# Patient Record
Sex: Male | Born: 1985 | Race: White | Hispanic: No | Marital: Married | Smoking: Never smoker
Health system: Southern US, Community
[De-identification: ages and names within clinical notes are randomized; demographics above are authoritative.]

## PROBLEM LIST (undated history)

## (undated) DIAGNOSIS — G43909 Migraine, unspecified, not intractable, without status migrainosus: Secondary | ICD-10-CM

## (undated) HISTORY — DX: Migraine, unspecified, not intractable, without status migrainosus: G43.909

---

## 2014-10-26 ENCOUNTER — Telehealth: Payer: Self-pay | Admitting: Physician Assistant

## 2014-10-26 NOTE — Telephone Encounter (Signed)
Pt called to establish with a doctor.  Pt is new to the area.  Pt has [employer].  I have scheduled appointment with pt/MJ

## 2014-11-01 ENCOUNTER — Ambulatory Visit (INDEPENDENT_AMBULATORY_CARE_PROVIDER_SITE_OTHER): Payer: BLUE CROSS/BLUE SHIELD | Admitting: Physician Assistant

## 2014-11-01 ENCOUNTER — Encounter: Payer: Self-pay | Admitting: Physician Assistant

## 2014-11-01 VITALS — BP 110/58 | HR 62 | Temp 98.1°F | Resp 64 | Ht 70.0 in | Wt 186.0 lb

## 2014-11-01 DIAGNOSIS — G43119 Migraine with aura, intractable, without status migrainosus: Secondary | ICD-10-CM

## 2014-11-01 DIAGNOSIS — G43909 Migraine, unspecified, not intractable, without status migrainosus: Secondary | ICD-10-CM | POA: Insufficient documentation

## 2014-11-01 DIAGNOSIS — Z418 Encounter for other procedures for purposes other than remedying health state: Secondary | ICD-10-CM | POA: Diagnosis not present

## 2014-11-01 DIAGNOSIS — Z298 Encounter for other specified prophylactic measures: Secondary | ICD-10-CM

## 2014-11-01 MED ORDER — ACETAZOLAMIDE 125 MG PO TABS: 125.0000 mg | ORAL_TABLET | Freq: Two times a day (BID) | ORAL | Status: DC

## 2014-11-01 NOTE — Patient Instructions (Signed)
Altitude Sickness °Altitude sickness occurs when a person goes to a high altitude (at least 8,200 ft [2,460 m] above sea level) without first letting the body adjust to the higher altitude (acclimate). Symptoms generally develop within 72 hours of arriving at high altitude. It can happen to anyone, regardless of physical condition. Altitude sickness is a medical emergency that can develop into a life-threatening condition.  °CAUSES  °Altitude sickness is caused by rapidly ascending to higher altitudes that expose you to lower air pressure and lower oxygen levels. Going to high altitudes quickly and exerting yourself can make altitude sickness more likely to occur.  °SYMPTOMS  °· Severe headache. °· Nausea and vomiting. °· Shortness of breath. °· Dizziness. °· Confusion. °· Uncoordinated movements. °· Fatigue and sleep disturbances. °· Weakness. °· Hallucinations. °DIAGNOSIS  °Your caregiver will take your medical history and perform a physical exam. A chest X-ray may also be taken. °TREATMENT  °In most cases of mild altitude sickness, your symptoms will resolve gradually over 3 to 5 days. If treatment is needed, it begins with moving to a lower altitude (1,800 ft [540 m] above sea level or lower) as quickly and safely as possible. Oxygen and certain medicines may also be given to help with breathing. In severe cases, you may need to stay in the hospital. °PREVENTION  °To prevent altitude sickness during future trips: °· Go to higher altitudes slowly, giving your body time to acclimate. °· Go to higher altitudes during the daytime and return to lower altitudes at night. °· Give your body a few days to adjust to a change in altitude before starting strenuous physical activities. °· Ask your caregiver about medicines you can take to prevent altitude sickness. °HOME CARE INSTRUCTIONS  °· If you must exercise, do so lightly for the first 24 to 36 hours after treatment. °· Drink enough fluids to keep your urine clear or  pale yellow. °· Eat small, light meals. °· Avoid smoking, calming medicines (sedatives), and alcohol. °· Remain at a low altitude. °· Have someone stay with you until you feel stable. °· Keep all follow-up appointments as directed by your caregiver. °SEEK IMMEDIATE MEDICAL CARE IF: °· You have severe shortness of breath at rest or with exertion. °· You have chest pain or tightness. °· You have a fast heartbeat. °· You have a severe headache. °· You have a severe cough. °· You have difficulty walking. °· You have difficulty concentrating. °· You feel confused. °MAKE SURE YOU:  °· Understand these instructions. °· Will watch your condition. °· Will get help right away if you are not doing well or get worse. °Document Released: 04/27/2000 Document Revised: 10/30/2011 Document Reviewed: 06/29/2011 °ExitCare® Patient Information ©2015 [employer]. This information is not intended to replace advice given to you by your health care provider. Make sure you discuss any questions you have with your health care provider. ° °

## 2014-11-01 NOTE — Progress Notes (Signed)
Subjective:    Patient ID: Brett Stephenson, male    DOB: 1985-11-04, 29 y.o.   MRN: [id number]  HPI New patient to establish care Previous Care Giver: Henri Medal, [facility], [facility] in [place], [facility], [facility]: Feels well no complaints, has good energy level. Patient is requesting a prescription for altitude sickness, pt reports he will be traveling to [place] next month for about 16 days to do some hiking.   Review of Systems  Constitutional: Negative.   HENT: Negative.   Eyes: Negative.   Respiratory: Negative.   Cardiovascular: Negative.   Gastrointestinal: Negative.   Endocrine: Negative.   Genitourinary: Negative.   Musculoskeletal: Negative.   Skin: Negative.   Allergic/Immunologic: Negative.   Neurological: Negative.   Hematological: Negative.   Psychiatric/Behavioral: Negative.    There are no active problems to display for this patient.  Past Medical History  Diagnosis Date  . Migraine    No current outpatient prescriptions on file prior to visit.   No current facility-administered medications on file prior to visit.   No Known Allergies History reviewed. No pertinent past surgical history. History   Social History  . Marital Status: Single    Spouse Name: N/A  . Number of Children: N/A  . Years of Education: N/A   Occupational History  . fulltime    Social History Main Topics  . Smoking status: Never Smoker   . Smokeless tobacco: Never Used  . Alcohol Use: 2.4 oz/week    4 Glasses of wine per week  . Drug Use: No  . Sexual Activity: Not on file   Other Topics Concern  . Not on file   Social History Narrative  . No narrative on file   Family History  Problem Relation Age of Onset  . Adopted: Yes        Objective:   Physical Exam  Constitutional: He is oriented to person, place, and time. He appears well-developed and well-nourished. No distress.  HENT:  Head:  Normocephalic and atraumatic.  Right Ear: Hearing, tympanic membrane, external ear and ear canal normal.  Left Ear: Hearing, tympanic membrane, external ear and ear canal normal.  Nose: Nose normal.  Mouth/Throat: No oropharyngeal exudate.  Eyes: Conjunctivae are normal. Pupils are equal, round, and reactive to light. Right eye exhibits no discharge. Left eye exhibits no discharge. No scleral icterus.  Neck: Normal range of motion. Neck supple. No tracheal deviation present. No thyromegaly present.  Cardiovascular: Normal rate, regular rhythm, normal heart sounds and intact distal pulses.  Exam reveals no gallop and no friction rub.   No murmur heard. Pulmonary/Chest: Effort normal and breath sounds normal. No respiratory distress. He has no wheezes. He has no rales. He exhibits no tenderness.  Abdominal: Soft. Bowel sounds are normal. He exhibits no distension and no mass. There is no tenderness. There is no rebound and no guarding.  Musculoskeletal: Normal range of motion.  Lymphadenopathy:    He has no cervical adenopathy.  Neurological: He is alert and oriented to person, place, and time. No cranial nerve deficit. Coordination normal.  Skin: Skin is warm and dry. He is not diaphoretic.  Psychiatric: He has a normal mood and affect. His behavior is normal. Judgment and thought content normal.  Vitals reviewed.   Blood pressure 110/58, pulse 62, temperature 98.1 F (36.7 C), temperature source Oral, resp. rate 64, height 5\' 10"  (1.778 m), weight 186 lb (84.369 kg), SpO2 98 %.  Assessment & Plan:  1. Intractable migraine with aura without status migrainosus Stable.  Uses relpax but has not taken in approx 4 months.  States he has chronic migraine with aura almost daily, but only takes the relpax at the most severe. - Ambulatory referral to Neurology  2. Altitude sickness prophylaxis Upcoming trip to [place] to hike [facility].  Prophylaxis treatment given and advised of  symptoms of altitude sickness. - acetaZOLAMIDE (DIAMOX) 125 MG tablet; Take 1 tablet (125 mg total) by mouth 2 (two) times daily.  Dispense: 16 tablet; Refill: 0

## 2015-03-23 ENCOUNTER — Ambulatory Visit (INDEPENDENT_AMBULATORY_CARE_PROVIDER_SITE_OTHER): Payer: BLUE CROSS/BLUE SHIELD | Admitting: Family Medicine

## 2015-03-23 ENCOUNTER — Encounter: Payer: Self-pay | Admitting: Family Medicine

## 2015-03-23 VITALS — BP 94/50 | HR 73 | Temp 98.5°F | Resp 16 | Ht 70.0 in | Wt 178.8 lb

## 2015-03-23 DIAGNOSIS — J069 Acute upper respiratory infection, unspecified: Secondary | ICD-10-CM | POA: Diagnosis not present

## 2015-03-23 MED ORDER — HYDROCODONE-HOMATROPINE 5-1.5 MG/5ML PO SYRP: ORAL_SOLUTION | ORAL | Status: DC

## 2015-03-23 NOTE — Progress Notes (Signed)
Subjective:     Patient ID: Brett Stephenson, male   DOB: 05/10/1986, 29 y.o.   MRN: [id number]  HPI  Chief Complaint  Patient presents with  . Cough    Patient comes in office today with concerns of cough and congestion for the past five days, patient reports he has had no other symptoms. He has tried taking otc Day/Night quil with no relief  Reports low grade fever. Cough is keeping him up at night.   Review of Systems     Objective:   Physical Exam  Constitutional: He appears well-developed and well-nourished. No distress.  Ears: T.M's intact without inflammation Throat: no tonsillar enlargement or exudate Neck: no cervical adenopathy Lungs: clear     Assessment:    1. Upper respiratory infection - HYDROcodone-homatropine (HYCODAN) 5-1.5 MG/5ML syrup; 5 ml 4-6 hours as needed for cough  Dispense: 240 mL; Refill: 0    Plan:   Discussed use of Mucinex D and Delsym.

## 2015-03-23 NOTE — Patient Instructions (Signed)
Discussed use of Mucinex D and Delsym 

## 2016-02-29 ENCOUNTER — Encounter: Payer: Self-pay | Admitting: Family Medicine

## 2016-02-29 ENCOUNTER — Ambulatory Visit (INDEPENDENT_AMBULATORY_CARE_PROVIDER_SITE_OTHER): Payer: BC Managed Care – PPO | Admitting: Family Medicine

## 2016-02-29 VITALS — BP 100/60 | HR 68 | Temp 98.3°F | Resp 16 | Wt 177.4 lb

## 2016-02-29 DIAGNOSIS — B349 Viral infection, unspecified: Secondary | ICD-10-CM

## 2016-02-29 MED ORDER — HYDROCODONE-HOMATROPINE 5-1.5 MG/5ML PO SYRP: ORAL_SOLUTION | ORAL | 0 refills | Status: DC

## 2016-02-29 MED ORDER — AMOXICILLIN-POT CLAVULANATE 875-125 MG PO TABS: 1.0000 | ORAL_TABLET | Freq: Two times a day (BID) | ORAL | 0 refills | Status: DC

## 2016-02-29 NOTE — Patient Instructions (Signed)
Discussed use of Mucinex D for congestion, Delsym for cough, and Benadryl for postnasal drainage 

## 2016-02-29 NOTE — Progress Notes (Signed)
Subjective:     Patient ID: Brett Stephenson, male   DOB: 05/14/1985, 30 y.o.   MRN: [id number]  HPI  Chief Complaint  Patient presents with  . Sinus Problem    Patient comes in office today with concerns of sinus pain and pressure for the past three days. Associated syptoms include congestion and sore throat, patient denies cough. Patient has been taking otc Day/Nyquil and Mucinex for relief.   States he developed symptoms while flying back from [place] while on his honeymoon. Denies fever but has had aches. + flu shot.   Review of Systems     Objective:   Physical Exam  Constitutional: He appears well-developed and well-nourished. No distress.  Ears: T.M's intact without inflammationr Throat: no tonsillar enlargement or exudate Neck: no cervical adenopathy Lungs: clear     Assessment:    1. Acute viral syndrome - HYDROcodone-homatropine (HYCODAN) 5-1.5 MG/5ML syrup; 5 ml 4-6 hours as needed for cough  Dispense: 240 mL; Refill: 0 - amoxicillin-clavulanate (AUGMENTIN) 875-125 MG tablet; Take 1 tablet by mouth 2 (two) times daily.  Dispense: 20 tablet; Refill: 0    Plan:    Discussed use of Mucinex D for congestion, Delsym for cough, and Benadryl for postnasal drainage   Start abx if sinuses not improving over the next few days.

## 2016-06-04 ENCOUNTER — Encounter: Payer: Self-pay | Admitting: Emergency Medicine

## 2016-06-04 ENCOUNTER — Emergency Department: Payer: BC Managed Care – PPO

## 2016-06-04 ENCOUNTER — Emergency Department
Admission: EM | Admit: 2016-06-04 | Discharge: 2016-06-04 | Disposition: A | Discharge status: Home / Self Care | Payer: BC Managed Care – PPO | Attending: Emergency Medicine | Admitting: Emergency Medicine

## 2016-06-04 DIAGNOSIS — B349 Viral infection, unspecified: Secondary | ICD-10-CM | POA: Diagnosis not present

## 2016-06-04 DIAGNOSIS — R509 Fever, unspecified: Secondary | ICD-10-CM | POA: Diagnosis present

## 2016-06-04 LAB — INFLUENZA PANEL BY PCR (TYPE A & B)
INFLAPCR: NEGATIVE
Influenza B By PCR: NEGATIVE

## 2016-06-04 IMAGING — CR DG CHEST 2V
1 series · 2 of 2 positions shown · non-contrast
Comparison: None in PACs

CLINICAL DATA: Three days of headache fever and chills, recent
negative flu chest. No shortness of breath or cough.

EXAM:
CHEST  2 VIEW

[Series 1: dg chest 2 view · 0.14mm/px · 2 of 2 slices shown]
[im 1/2]
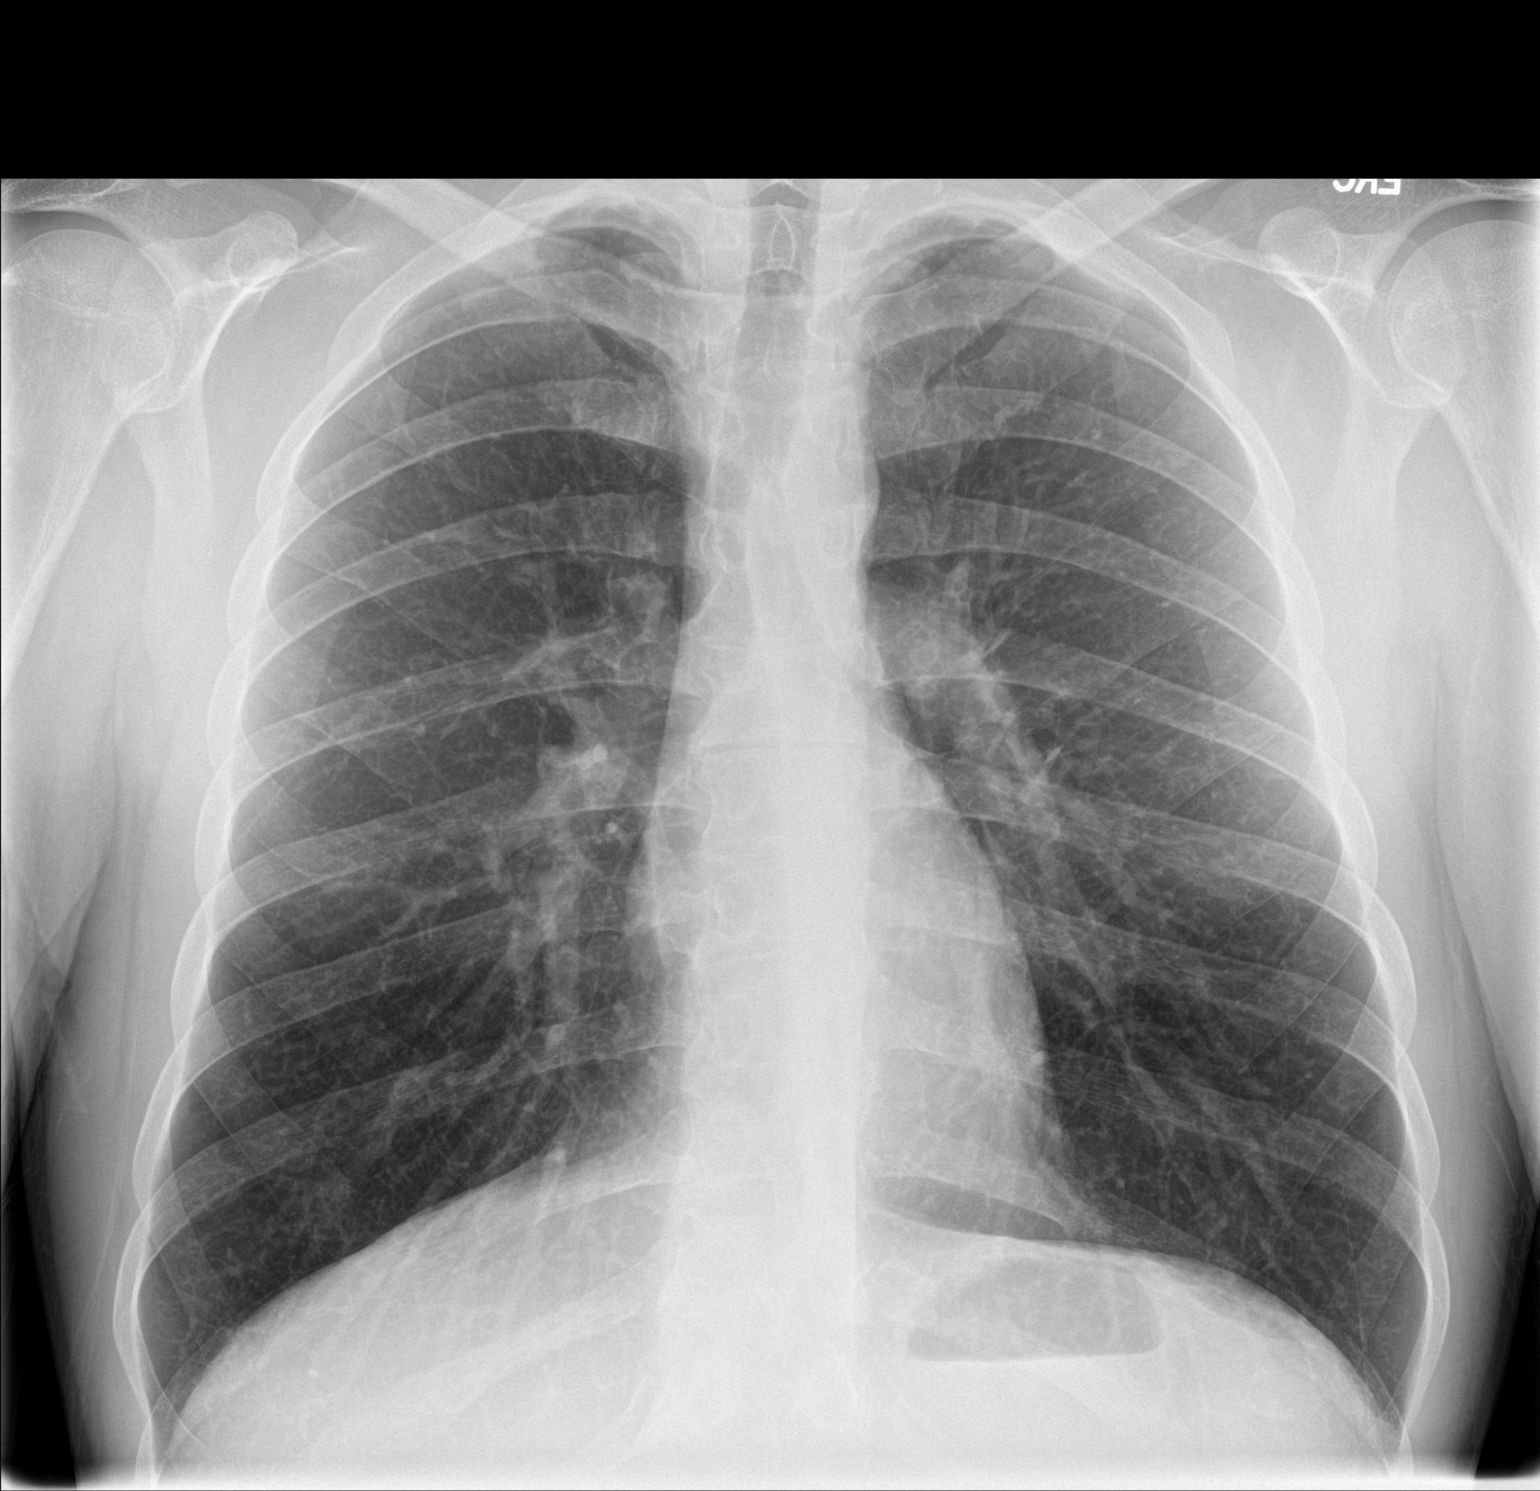
[im 2/2]
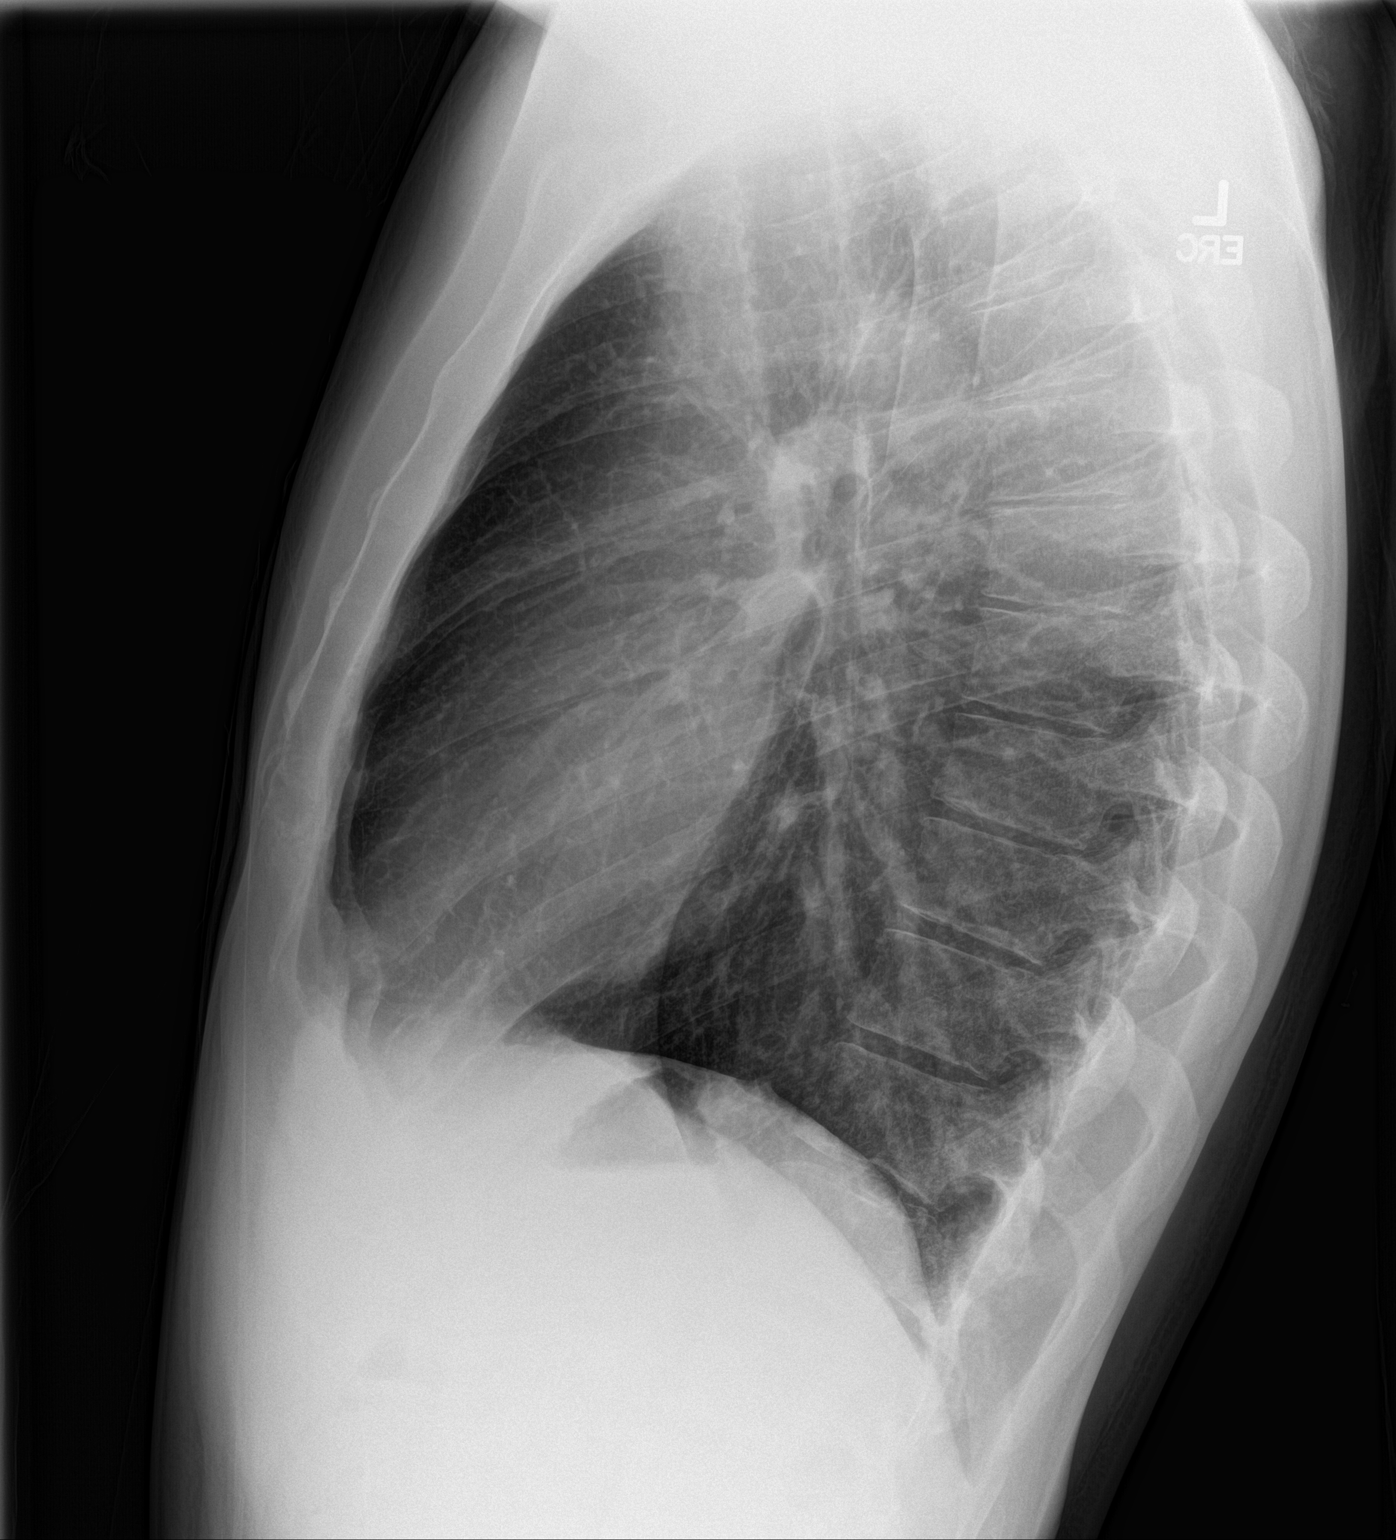

[2 of 2 positions shown; findings below may reference images not displayed]

FINDINGS: The lungs are well-expanded and clear. The heart and pulmonary
vascularity are normal. The mediastinum is normal in width. There is
no pleural effusion. The bony thorax is unremarkable.
IMPRESSION: There is no active cardiopulmonary disease.

## 2016-06-04 MED ORDER — TRAMADOL HCL 50 MG PO TABS: 50.0000 mg | ORAL_TABLET | Freq: Four times a day (QID) | ORAL | 0 refills | Status: DC | PRN

## 2016-06-04 MED ORDER — TRAMADOL HCL 50 MG PO TABS
50.0000 mg | ORAL_TABLET | Freq: Once | ORAL | Status: AC
  Administered 2016-06-04: 50 mg via ORAL
  Filled 2016-06-04: qty 1

## 2016-06-04 NOTE — ED Triage Notes (Signed)
Patient states that he has had a headache, fever and chills times three days. Patient states that he was seen at urgent care yesterday and tested negative for the flu. Patient states that his temperature is now higher and that he has some upper back pain. Patient states that his temperature was 100.2 at home this morning.

## 2016-06-04 NOTE — ED Notes (Signed)
See triage note   States his sx's started about 3 days ago  Had neg flu but placed on tamiflu yesterday states he was febrile at home but afebrile on arrival

## 2016-06-04 NOTE — ED Provider Notes (Signed)
[facility] Emergency Department Provider Note   ____________________________________________   First MD Initiated Contact with Patient 06/04/16 [phone]     (approximate)  I have reviewed the triage vital signs and the nursing notes.   HISTORY  Chief Complaint Headache; Fever; Chills; and Back Pain    HPI Brett Stephenson is a 31 y.o. male patient complaining of headache, fever, chills, body aches for 3 days. Patient say he was seen yesterday at urgent care clinic and tested negative for flu. Patient was given a prescription for Tamiflu. Patient states continue to have fever and upper back pain. Patient said his temperature at home was 100.2. His temperature at triage was 98.4 no palliative measures except for the Tamiflu for his complaints. He denies nausea vomiting diarrhea.She rates his pain as a 10 over 10. Patient describes pain as "achy".   Past Medical History:  Diagnosis Date  . Migraine     Patient Active Problem List   Diagnosis Date Noted  . Migraines 11/01/2014    History reviewed. No pertinent surgical history.  Prior to Admission medications   Medication Sig Start Date End Date Taking? Authorizing Provider  amoxicillin-clavulanate (AUGMENTIN) 875-125 MG tablet Take 1 tablet by mouth 2 (two) times daily. 02/29/16   Anola Gurney, PA  eletriptan (RELPAX) 20 MG tablet Take 20 mg by mouth as needed for migraine or headache. May repeat in 2 hours if headache persists or recurs.    Historical Provider, MD  HYDROcodone-homatropine [facility]) 5-1.5 MG/5ML syrup 5 ml 4-6 hours as needed for cough 02/29/16   Anola Gurney, PA  traMADol (ULTRAM) 50 MG tablet Take 1 tablet (50 mg total) by mouth every 6 (six) hours as needed for moderate pain. 06/04/16   Joni Reining, PA-C    Allergies Patient has no known allergies.  Family History  Problem Relation Age of Onset  . Adopted: Yes    Social History Social History  Substance Use Topics  .  Smoking status: Never Smoker  . Smokeless tobacco: Never Used  . Alcohol use 2.4 oz/week    4 Glasses of wine per week    Review of Systems Constitutional: Fever, chills, body aches.  Eyes: No visual changes. ENT: No sore throat. Cardiovascular: Denies chest pain. Respiratory: Denies shortness of breath. Gastrointestinal: No abdominal pain.  No nausea, no vomiting.  No diarrhea.  No constipation. Genitourinary: Negative for dysuria. Musculoskeletal: Upper back pain. Skin: Negative for rash. Neurological: Negative for headaches, denies focal weakness or numbness.    ____________________________________________   PHYSICAL EXAM:  VITAL SIGNS: ED Triage Vitals  Enc Vitals Group     BP 06/04/16 0606 132/80     Pulse Rate 06/04/16 0606 94     Resp 06/04/16 0606 18     Temp 06/04/16 0606 98.4 F (36.9 C)     Temp Source 06/04/16 0606 Oral     SpO2 06/04/16 0606 99 %     Weight 06/04/16 0606 175 lb (79.4 kg)     Height 06/04/16 0606 5\' 10"  (1.778 m)     Head Circumference --      Peak Flow --      Pain Score 06/04/16 0607 10     Pain Loc --      Pain Edu? --      Excl. in GC? --     Constitutional: Alert and oriented. Well appearing and in no acute distress. Afebrile Eyes: Conjunctivae are normal. PERRL. EOMI. Head: Atraumatic. Nose: No congestion/rhinnorhea.  Mouth/Throat: Mucous membranes are moist.  Oropharynx non-erythematous. Neck: No stridor.  No cervical spine tenderness to palpation. Hematological/Lymphatic/Immunilogical: No cervical lymphadenopathy. Cardiovascular: Normal rate, regular rhythm. Grossly normal heart sounds.  Good peripheral circulation. Respiratory: Normal respiratory effort.  No retractions. Lungs CTAB. Gastrointestinal: Soft and nontender. No distention. No abdominal bruits. No CVA tenderness. Musculoskeletal: No lower extremity tenderness nor edema.  No joint effusions. Neurologic:  Normal speech and language. No gross focal neurologic  deficits are appreciated. No gait instability. Skin:  Skin is warm, dry and intact. No rash noted. Psychiatric: Mood and affect are normal. Speech and behavior are normal.  ____________________________________________   LABS (all labs ordered are listed, but only abnormal results are displayed)  Labs Reviewed  INFLUENZA PANEL BY PCR (TYPE A & B)   ____________________________________________  EKG   ____________________________________________  RADIOLOGY  No acute final chest x-ray ____________________________________________   PROCEDURES  Procedure(s) performed: None  Procedures  Critical Care performed: No  ____________________________________________   INITIAL IMPRESSION / ASSESSMENT AND PLAN / ED COURSE  Pertinent labs & imaging results that were available during my care of the patient were reviewed by me and considered in my medical decision making (see chart for details).  Viral illness. Patient given discharge care instructions. Patient advised continuing Tamiflu and given tramadol. [facility].      ____________________________________________   FINAL CLINICAL IMPRESSION(S) / ED DIAGNOSES Gross negative rapid flu tests and x-ray findings with patient. Final diagnoses:  Viral illness      NEW MEDICATIONS STARTED DURING THIS VISIT:  New Prescriptions   TRAMADOL (ULTRAM) 50 MG TABLET    Take 1 tablet (50 mg total) by mouth every 6 (six) hours as needed for moderate pain.     Note:  This document was prepared using Dragon voice recognition software and may include unintentional dictation errors.    Joni Reiningonald K Kyrstal Monterrosa, PA-C 06/04/16 [id number]    Rockne MenghiniAnne-Caroline Norman, MD 06/04/16 1623

## 2016-06-07 ENCOUNTER — Ambulatory Visit (INDEPENDENT_AMBULATORY_CARE_PROVIDER_SITE_OTHER): Payer: BC Managed Care – PPO | Admitting: Family Medicine

## 2016-06-07 ENCOUNTER — Encounter: Payer: Self-pay | Admitting: Family Medicine

## 2016-06-07 VITALS — BP 90/48 | HR 68 | Temp 98.2°F | Resp 16 | Wt 179.4 lb

## 2016-06-07 DIAGNOSIS — B349 Viral infection, unspecified: Secondary | ICD-10-CM | POA: Diagnosis not present

## 2016-06-07 NOTE — Patient Instructions (Addendum)
Stop Tamiflu and tramadol. Take Tylenol for pain. Start eating again. If not getting better return tomorrow. If stiff neck along with headache and fever report to the emergency room. Viral Meningitis, Adult Viral meningitis is an infection of the tissues (meninges) that cover the brain and spinal cord. Many common viruses can cause viral meningitis. Most people with viral meningitis get better without treatment in about 10 days. However, it is important to be evaluated by your health care provider to make sure you do not have bacterial meningitis. Bacterial meningitis has similar symptoms, but it is much more dangerous and must be treated quickly with antibiotics. What are the causes? Many common viruses can cause viral meningitis, including:  Enteroviruses. These types of viruses are the most common cause of viral meningitis.  Herpes.  HIV (human immunodeficiency virus).  Measles.  Mumps.  Chicken pox (varicella-zoster).  Flu (influenza) viruses. These viruses can be spread in different ways, such as through contact with:  Stool. This means that you could get sick by touching something that has been contaminated with infected stool and then touching your eyes, nose, or mouth.  Respiratory secretions. This means that you could get sick from coughs or sneezes of an infected person, similar to the spreading of the common cold.  Infected blood or infected bodily fluids.  Rodents.  Mosquito bites or tick bites. When a virus enters your system, it can spread through the blood to reach the brain and spinal cord. What increases the risk? You may be at higher risk for meningitis if you have a weakened disease-fighting system (immune system). What are the signs or symptoms? Symptoms of viral meningitis may be similar to symptoms of a cold or flu. Signs and symptoms may include:  Fever.  Headache.  Stiff neck.  Muscle aches.  Nausea and vomiting.  Sensitivity to  light.  Tiredness.  Cough. How is this diagnosed? This condition may be diagnosed based on your symptoms, your medical history, and a physical exam. You may be asked to touch your chin to your neck to see if this causes pain. You may have tests, such as:  Lumbar puncture. In this procedure, also called a spinal tap, a small amount of fluid from your spinal canal (cerebrospinal fluid) is removed and analyzed.  Blood tests.  Other fluid or tissue samples.  CT scan.  MRI. How is this treated? Most types of viral meningitis go away without treatment. You may be given antibiotic medicine through an IV tube until your health care provider is sure that you do not have bacterial meningitis. The antibiotic will be stopped as soon as viral meningitis is diagnosed. Depending on the type of virus that caused your meningitis, you may be given:  Antiretroviral medicine.  Antiviral medicine.  Medicines that reduce fever and pain.  Medicines that reduce swelling (steroids). Follow these instructions at home:  Take over-the-counter and prescription medicines only as told by your health care provider.  If you are taking a medicine for viral meningitis, do not stop taking the medicine even if you start to feel better.  Drink enough fluid to keep your urine clear or pale yellow.  Rest at home until you feel better. Return to your normal activities as told by your health care provider.  Keep all follow-up visits as told by your health care provider. This is important. How is this prevented?  Get a flu shot (influenza vaccination) every year. This will help prevent meningitis that is caused by flu viruses.  Wash your hands often with soap and water. If soap and water are not available, use hand sanitizer.  Avoid touching your hands to your face when you have not washed your hands recently.  Avoid close contact with people who are sick.  Disinfect counters and other surfaces if someone in  your home is sick.  Stay home while you are sick, and try to stay away from others as much as possible to avoid spreading the infection.  Cover your nose and mouth when you sneeze or cough.  Use insect repellent to prevent mosquito bites. Contact a health care provider if:  Your symptoms do not improve after 7-10 days.  You have a fever that does not get better with medicine. Get help right away if:  Your symptoms get worse.  You become confused.  You become very sleepy. This information is not intended to replace advice given to you by your health care provider. Make sure you discuss any questions you have with your health care provider. Document Released: 08/22/2015 Document Revised: 10/06/2015 Document Reviewed: 07/04/2015 Elsevier Interactive Patient Education  2017 [employer]

## 2016-06-07 NOTE — Progress Notes (Signed)
Subjective:     Patient ID: Brett GawRobert Stephenson, male   DOB: 12/25/1985, 31 y.o.   MRN: [id number]  HPI  Chief Complaint  Patient presents with  . Fever    Patient comes in office today with concerns of fever,headache and vomiting for the past 4 days. Patient states that fever was at its highest yesterday at 100.2, but mostly staying at 99. Associated with fever patient complains of body aches, fatigue, dizziness. Patient reports that he did have his flu shot this past year and has been taking otc Tylenol and Advil.   States he last vomited last night. Has been taking both Tamiflu and tramadol for his sx provided by Urgent Care and subsequently the ER (06/07/16). Continues to have headache and fever of 100.2 last night. Has taken Advil prior to his appointment this AM. He is in day #6 of his illness without respiratory sx.   Review of Systems     Objective:   Physical Exam  Constitutional: He appears well-developed and well-nourished. No distress.  Neurological:  Meningeal signs negative.  Ears: T.M's intact without inflammation Throat: no tonsillar enlargement or exudate Neck: no cervical adenopathy Lungs: clear     Assessment:    1. Viral illness: possible viral meningitis; Nausea may be iatrogenic     Plan:    Stop Tamiflu and tramadol. Use Tylenol for pain. F/u tomorrow prn. ER if neck stiffness along with headache and fever.

## 2016-06-08 ENCOUNTER — Encounter: Payer: Self-pay | Admitting: Emergency Medicine

## 2016-06-08 ENCOUNTER — Observation Stay
Admission: EM | Admit: 2016-06-08 | Discharge: 2016-06-09 | Disposition: A | Discharge status: Home / Self Care | Payer: BC Managed Care – PPO | Attending: Internal Medicine | Admitting: Internal Medicine

## 2016-06-08 DIAGNOSIS — A879 Viral meningitis, unspecified: Secondary | ICD-10-CM | POA: Diagnosis not present

## 2016-06-08 DIAGNOSIS — G039 Meningitis, unspecified: Secondary | ICD-10-CM

## 2016-06-08 DIAGNOSIS — R531 Weakness: Secondary | ICD-10-CM | POA: Diagnosis not present

## 2016-06-08 LAB — CSF CELL COUNT WITH DIFFERENTIAL
Eosinophils, CSF: 0 %
Eosinophils, CSF: 0 %
Lymphs, CSF: 94 %
Lymphs, CSF: 97 %
Monocyte-Macrophage-Spinal Fluid: 3 %
Monocyte-Macrophage-Spinal Fluid: 5 %
RBC COUNT CSF: 19 /mm3 — AB (ref 0–3)
RBC COUNT CSF: 88 /mm3 — AB (ref 0–3)
SEGMENTED NEUTROPHILS-CSF: 0 %
Segmented Neutrophils-CSF: 1 %
Tube #: 1
WBC CSF: 610 /mm3 — AB (ref 0–5)
WBC, CSF: 425 /mm3 (ref 0–5)

## 2016-06-08 LAB — COMPREHENSIVE METABOLIC PANEL
ALBUMIN: 4.7 g/dL (ref 3.5–5.0)
ALT: 17 U/L (ref 17–63)
ANION GAP: 8 (ref 5–15)
AST: 14 U/L — AB (ref 15–41)
Alkaline Phosphatase: 36 U/L — ABNORMAL LOW (ref 38–126)
BILIRUBIN TOTAL: 0.9 mg/dL (ref 0.3–1.2)
BUN: 13 mg/dL (ref 6–20)
CO2: 28 mmol/L (ref 22–32)
Calcium: 8.9 mg/dL (ref 8.9–10.3)
Chloride: 102 mmol/L (ref 101–111)
Creatinine, Ser: 1.01 mg/dL (ref 0.61–1.24)
GFR calc Af Amer: >60 mL/min (ref >60)
Glucose, Bld: 94 mg/dL (ref 65–99)
POTASSIUM: 3.9 mmol/L (ref 3.5–5.1)
Sodium: 138 mmol/L (ref 135–145)
TOTAL PROTEIN: 7.3 g/dL (ref 6.5–8.1)

## 2016-06-08 LAB — CBC WITH DIFFERENTIAL/PLATELET
BASOS ABS: 0 10*3/uL (ref 0–0.1)
BASOS PCT: 0 %
EOS PCT: 3 %
Eosinophils Absolute: 0.1 10*3/uL (ref 0–0.7)
HEMATOCRIT: 41.2 % (ref 40.0–52.0)
Hemoglobin: 14.3 g/dL (ref 13.0–18.0)
Lymphocytes Relative: 17 %
Lymphs Abs: 0.9 10*3/uL — ABNORMAL LOW (ref 1.0–3.6)
MCH: 29.3 pg (ref 26.0–34.0)
MCHC: 34.6 g/dL (ref 32.0–36.0)
MCV: 84.5 fL (ref 80.0–100.0)
MONOS PCT: 12 %
Monocytes Absolute: 0.6 10*3/uL (ref 0.2–1.0)
NEUTROS ABS: 3.8 10*3/uL (ref 1.4–6.5)
Neutrophils Relative %: 68 %
PLATELETS: 86 10*3/uL — AB (ref 150–440)
RBC: 4.88 MIL/uL (ref 4.40–5.90)
RDW: 13.1 % (ref 11.5–14.5)
WBC: 5.5 10*3/uL (ref 3.8–10.6)

## 2016-06-08 LAB — SEDIMENTATION RATE: SED RATE: 2 mm/h (ref 0–15)

## 2016-06-08 LAB — PATHOLOGIST SMEAR REVIEW

## 2016-06-08 LAB — PROTEIN AND GLUCOSE, CSF
GLUCOSE CSF: 43 mg/dL (ref 40–70)
TOTAL PROTEIN, CSF: 195 mg/dL — AB (ref 15–45)

## 2016-06-08 LAB — RAPID HIV SCREEN (HIV 1/2 AB+AG)
HIV 1/2 Antibodies: NONREACTIVE
HIV-1 P24 Antigen - HIV24: NONREACTIVE

## 2016-06-08 MED ORDER — ONDANSETRON HCL 4 MG/2ML IJ SOLN: 4.0000 mg | Freq: Four times a day (QID) | INTRAMUSCULAR | Status: DC | PRN

## 2016-06-08 MED ORDER — LORAZEPAM 2 MG/ML IJ SOLN
1.0000 mg | Freq: Once | INTRAMUSCULAR | Status: AC
  Administered 2016-06-08: 1 mg via INTRAVENOUS
  Filled 2016-06-08: qty 1

## 2016-06-08 MED ORDER — TRAMADOL HCL 50 MG PO TABS
50.0000 mg | ORAL_TABLET | Freq: Four times a day (QID) | ORAL | Status: DC | PRN
  Administered 2016-06-08 – 2016-06-09 (×3): 50 mg via ORAL
  Filled 2016-06-08 (×3): qty 1

## 2016-06-08 MED ORDER — KETOROLAC TROMETHAMINE 30 MG/ML IJ SOLN
30.0000 mg | Freq: Four times a day (QID) | INTRAMUSCULAR | Status: DC | PRN
  Administered 2016-06-08: 30 mg via INTRAVENOUS
  Filled 2016-06-08: qty 1

## 2016-06-08 MED ORDER — SENNA 8.6 MG PO TABS
1.0000 | ORAL_TABLET | Freq: Two times a day (BID) | ORAL | Status: DC
  Administered 2016-06-08 – 2016-06-09 (×2): 8.6 mg via ORAL
  Filled 2016-06-08 (×2): qty 1

## 2016-06-08 MED ORDER — SODIUM CHLORIDE 0.9 % IV BOLUS (SEPSIS)
1000.0000 mL | Freq: Once | INTRAVENOUS | Status: AC
  Administered 2016-06-08: 1000 mL via INTRAVENOUS

## 2016-06-08 MED ORDER — ENOXAPARIN SODIUM 40 MG/0.4ML ~~LOC~~ SOLN
40.0000 mg | SUBCUTANEOUS | Status: DC
  Administered 2016-06-08: 40 mg via SUBCUTANEOUS
  Filled 2016-06-08: qty 0.4

## 2016-06-08 MED ORDER — SODIUM CHLORIDE 0.9 % IV SOLN
Freq: Once | INTRAVENOUS | Status: AC
  Administered 2016-06-08: 13:00:00 via INTRAVENOUS

## 2016-06-08 MED ORDER — SENNOSIDES-DOCUSATE SODIUM 8.6-50 MG PO TABS
1.0000 | ORAL_TABLET | Freq: Every evening | ORAL | Status: DC | PRN
  Administered 2016-06-08: 1 via ORAL
  Filled 2016-06-08: qty 1

## 2016-06-08 MED ORDER — DEXTROSE 5 % IV SOLN: 1.0000 g | Freq: Once | INTRAVENOUS | Status: DC

## 2016-06-08 MED ORDER — CEFTRIAXONE SODIUM-DEXTROSE 1-3.74 GM-% IV SOLR
1.0000 g | Freq: Once | INTRAVENOUS | Status: DC
  Filled 2016-06-08: qty 50

## 2016-06-08 MED ORDER — ACETAMINOPHEN 325 MG PO TABS
650.0000 mg | ORAL_TABLET | Freq: Four times a day (QID) | ORAL | Status: DC | PRN
  Administered 2016-06-09: 650 mg via ORAL
  Filled 2016-06-08: qty 2

## 2016-06-08 MED ORDER — ACETAMINOPHEN 650 MG RE SUPP: 650.0000 mg | Freq: Four times a day (QID) | RECTAL | Status: DC | PRN

## 2016-06-08 MED ORDER — AZITHROMYCIN 500 MG PO TABS
500.0000 mg | ORAL_TABLET | Freq: Once | ORAL | Status: DC
  Filled 2016-06-08: qty 1

## 2016-06-08 MED ORDER — ONDANSETRON HCL 4 MG PO TABS: 4.0000 mg | ORAL_TABLET | Freq: Four times a day (QID) | ORAL | Status: DC | PRN

## 2016-06-08 NOTE — ED Triage Notes (Signed)
Pt seen at [facility] and diagnosed with viral meningitis. Pt was told if not better by today to come to ED.

## 2016-06-08 NOTE — Progress Notes (Signed)
On admission: Patient alert and oriented. Complaining of headache 8 or 9 out of 10. Patient has small red rash on right elbow. Lung sounds clear. Patient says he is sensitive to bright lights and have some lower back pain that has been occurring since Sunday. Patient is independent and has been oriented to the room.  Harvie HeckMelanie Zayleigh Stroh, RN

## 2016-06-08 NOTE — ED Notes (Signed)
ED Provider at bedside. 

## 2016-06-08 NOTE — H&P (Signed)
[facility] [place] [facility]   PATIENT NAME: Brett GawRobert Stephenson    MR#:  [id number]  DATE OF BIRTH:  09/18/1985  DATE OF ADMISSION:  06/08/2016  PRIMARY CARE PHYSICIAN: Margaretann LovelessJennifer M Burnette, PA-C   REQUESTING/REFERRING PHYSICIAN: Dr. Thomasene MohairPaduhowski  CHIEF COMPLAINT:   Frontal headache and low-grade fever since passed and that HISTORY OF PRESENT ILLNESS:  Brett GawRobert Stephenson  is a 31 y.o. male with a known history of Migraine headaches comes to the emergency room from home accompanied by family. Patient said he started having low-grade fever not feeling well and headache since past Sunday. He saw his primary care physician was empirically started on Tamiflu despite being tested 2 negative. He was given some Ultram and and recommended to come to the emergency room if he continued to have headache. Patient did not have any fever this morning. He did have some episode of emesis yesterday. He did develop some soreness in his neck although he is able to move his neck around well. He is being admitted for possible may require meningitis. In the emergency room and lumbar spine CSF was drawn which favors towards viral meningitis at present. Blood cultures were drawn. Patient is being admitted for further evaluation and management for overnight observation  PAST MEDICAL HISTORY:   Past Medical History:  Diagnosis Date  . Migraine     PAST SURGICAL HISTOIRY:  History reviewed. No pertinent surgical history.  SOCIAL HISTORY:   Social History  Substance Use Topics  . Smoking status: Never Smoker  . Smokeless tobacco: Never Used  . Alcohol use 3.0 oz/week    5 Glasses of wine per week    FAMILY HISTORY:   Family History  Problem Relation Age of Onset  . Adopted: Yes    DRUG ALLERGIES:  No Known Allergies  REVIEW OF SYSTEMS:  Review of Systems  Constitutional: Positive for malaise/fatigue. Negative for chills, fever and weight loss.  HENT: Negative for ear  discharge, ear pain and nosebleeds.   Eyes: Negative for blurred vision, pain and discharge.  Respiratory: Negative for sputum production, shortness of breath, wheezing and stridor.   Cardiovascular: Negative for chest pain, palpitations, orthopnea and PND.  Gastrointestinal: Positive for vomiting. Negative for abdominal pain, diarrhea and nausea.  Genitourinary: Negative for frequency and urgency.  Musculoskeletal: Negative for back pain and joint pain.  Neurological: Positive for weakness and headaches. Negative for sensory change, speech change and focal weakness.  Psychiatric/Behavioral: Negative for depression and hallucinations. The patient is not nervous/anxious.      MEDICATIONS AT HOME:   Prior to Admission medications   Not on File      VITAL SIGNS:  Blood pressure 108/72, pulse 68, temperature 97.6 F (36.4 C), temperature source Oral, resp. rate 18, height 5\' 10"  (1.778 m), weight 79.4 kg (175 lb), SpO2 100 %.  PHYSICAL EXAMINATION:  GENERAL:  31 y.o.-year-old patient lying in the bed with no acute distress.  EYES: Pupils equal, round, reactive to light and accommodation. No scleral icterus. Extraocular muscles intact.  HEENT: Head atraumatic, normocephalic. Oropharynx and nasopharynx clear.  NECK:  Supple, no jugular venous distention. No thyroid enlargement, no tenderness.  LUNGS: Normal breath sounds bilaterally, no wheezing, rales,rhonchi or crepitation. No use of accessory muscles of respiration.  CARDIOVASCULAR: S1, S2 normal. No murmurs, rubs, or gallops.  ABDOMEN: Soft, nontender, nondistended. Bowel sounds present. No organomegaly or mass.  EXTREMITIES: No pedal edema, cyanosis, or clubbing.  NEUROLOGIC: Cranial nerves II through XII are  intact. Muscle strength 5/5 in all extremities. Sensation intact. Gait not checked. Mild neck tenderness but good range of motion. PSYCHIATRIC: The patient is alert and oriented x 3.  SKIN: No obvious rash, lesion, or ulcer.    LABORATORY PANEL:   CBC  Recent Labs Lab 06/08/16 0815  WBC 5.5  HGB 14.3  HCT 41.2  PLT 86*   ------------------------------------------------------------------------------------------------------------------  Chemistries   Recent Labs Lab 06/08/16 0815  NA 138  K 3.9  CL 102  CO2 28  GLUCOSE 94  BUN 13  CREATININE 1.01  CALCIUM 8.9  AST 14*  ALT 17  ALKPHOS 36*  BILITOT 0.9   ------------------------------------------------------------------------------------------------------------------  Cardiac Enzymes No results for input(s): TROPONINI in the last 168 hours. ------------------------------------------------------------------------------------------------------------------  RADIOLOGY:  No results found.  EKG:    IMPRESSION AND PLAN:  Brett Stephenson  is a 31 y.o. male with a known history of Migraine headaches comes to the emergency room from home accompanied by family. Patient said he started having low-grade fever not feeling well and headache since past Sunday. He saw his primary care physician was empirically started on Tamiflu despite being tested 2 negative.   1. Intractable headache with low-grade fever without any symptoms of URI or congestion suggestive of viral meningitis -Patient had LP done CSF favors towards viral meningitis. CSF culture pending. -Infectious disease consultation. Case was discussed with Dr. Sampson Goon recommends to hold off on IV antibiotics. No indication for Tamiflu. Symptom management with IV fluids and when necessary meds and monitor fever curve -HSV PCR and enterovirus PCR from CSF -Rapid HIV testing  2. Generalized weakness -IV fluids. Patient advised to keep fluids down and try to eat food  3. DVT prophylaxis Lovenox  Above was discussed with patient and family agreeable to it.  All the records are reviewed and case discussed with ED provider. Management plans discussed with the patient, family and they are in  agreement.  CODE STATUS: Full  TOTAL TIME TAKING CARE OF THIS PATIENT: 45 minutes.    Israel Werts M.D on 06/08/2016 at 12:19 PM  Between 7am to 6pm - Pager - [phone]  After 6pm go to [email or web address] - password [email or web address] [facility]  [facility]  Office  [phone]  CC: Primary care physician; Margaretann Loveless, PA-C

## 2016-06-08 NOTE — ED Provider Notes (Addendum)
[facility] Emergency Department Provider Note  Time seen: 8:17 AM  I have reviewed the triage vital signs and the nursing notes.   HISTORY  Chief Complaint Fever and Headache    HPI Brett Stephenson is a 31 y.o. male with no past medical history who presents to the emergency department with 5 days of fever, headache, nausea, vomiting. Patient states he was seen twice over the past 5 days for the same illness, had negative flu testing twice. Patient was prescribed Tamiflu as a precaution as well as tramadol for discomfort. Patient has been taking without improvement. Patient states he went back to his primary care doctor yesterday he was concerned that he could be experiencing meningitis. Told the patient if his symptoms did not improve he should go to the emergency department for evaluation. Patient states continued headache so he came to the emergency department. Patient states mild neck discomfort which started yesterday. 9/10 headache currently. Denies any focal weakness or numbness. Patient has had intermittent fevers, last took ibuprofen at 4 AM (4 hours prior). Denies cough, congestion or sore throat.  Past Medical History:  Diagnosis Date  . Migraine     Patient Active Problem List   Diagnosis Date Noted  . Migraines 11/01/2014    History reviewed. No pertinent surgical history.  Prior to Admission medications   Medication Sig Start Date End Date Taking? Authorizing Provider  oseltamivir (TAMIFLU) 75 MG capsule  06/03/16   Historical Provider, MD  traMADol (ULTRAM) 50 MG tablet Take 1 tablet (50 mg total) by mouth every 6 (six) hours as needed for moderate pain. 06/04/16   Sable Feil, PA-C    No Known Allergies  Family History  Problem Relation Age of Onset  . Adopted: Yes    Social History Social History  Substance Use Topics  . Smoking status: Never Smoker  . Smokeless tobacco: Never Used  . Alcohol use 3.0 oz/week    5 Glasses of wine  per week    Review of Systems Constitutional: Intermittent fever. ENT: Negative for congestion Cardiovascular: Negative for chest pain. Respiratory: Negative for shortness of breath. Gastrointestinal: Negative for abdominal pain. Positive for nausea and vomiting. Negative for diarrhea. Genitourinary: Negative for dysuria. Neurological: Negative for headache 10-point ROS otherwise negative.  ____________________________________________   PHYSICAL EXAM:  VITAL SIGNS: ED Triage Vitals  Enc Vitals Group     BP 06/08/16 0742 122/65     Pulse Rate 06/08/16 0742 69     Resp 06/08/16 0742 20     Temp 06/08/16 0742 97.6 F (36.4 C)     Temp Source 06/08/16 0742 Oral     SpO2 06/08/16 0742 99 %     Weight 06/08/16 0744 175 lb (79.4 kg)     Height 06/08/16 0744 _0  (1.778 m)     Head Circumference --      Peak Flow --      Pain Score 06/08/16 0754 9     Pain Loc --      Pain Edu? --      Excl. in [place]? --    Constitutional: Alert and oriented. No distress. Keeps his eyes closed most of the exam. Eyes: Normal exam ENT   Head: Normocephalic and atraumatic. Mild nuchal rigidity.   Nose: No congestion/rhinnorhea.   Mouth/Throat: Mucous membranes are moist. Cardiovascular: Normal rate, regular rhythm. No murmur Respiratory: Normal respiratory effort without tachypnea nor retractions. Breath sounds are clear Gastrointestinal: Soft and nontender. No distention.  Musculoskeletal: Nontender with normal range of motion in all extremities.  Neurologic:  Normal speech and language. No gross focal neurologic deficits Skin:  Skin is warm, dry and intact.  Psychiatric: Mood and affect are normal.   ____________________________________________    INITIAL IMPRESSION / ASSESSMENT AND PLAN / ED COURSE  Pertinent labs & imaging results that were available during my care of the patient were reviewed by me and considered in my medical decision making (see chart for  details).  The patient presents to the emergency department with fever, intermittent nausea, headache, neck pain. Patient states this morning he awoke and he could not talk. He states he knew what he wanted to say but he could not articulate the words. Patient states that has since improved.  Patient currently taking Tamiflu although he states to negative flu test including in the emergency department. We will check labs, likely proceed with lumbar puncture for CSF examination.  Labs are largely within normal limits. ESR negative. CBC normal, besides slight thrombocytopenia. Metabolic panel normal. Lumbar puncture performed by myself, verbal consent obtained. Largely clear fluid.  LUMBAR PUNCTURE  Date/Time: 06/08/2016 at 9:44 AM Performed by: Harvest Dark  Consent: Verbal consent obtained.  Risks and benefits: risks, benefits and alternatives were discussed Consent given by: patient Patient understanding: patient states understanding of the procedure being performed  Patient consent: the patient's understanding of the procedure matches consent given  Procedure consent: procedure consent matches procedure scheduled  Relevant documents: relevant documents present and verified  Test results: test results available and properly labeled Site marked: the operative site was marked Imaging studies: imaging studies available  Required items: required blood products, implants, devices, and special equipment available  Patient identity confirmed: verbally with patient and arm band  Time out: Immediately prior to procedure a "time out" was called to verify the correct patient, procedure, equipment, support staff and site/side marked as required.  Indications: rule out meningitis Anesthesia: local infiltration Local anesthetic: lidocaine 1% without epinephrine Anesthetic total: 5 ml Patient sedated: no Analgesia: 35m ativan, no analgesia given Preparation: Patient was prepped and draped  in the usual sterile fashion. Lumbar space: L3-L4 interspace Patient's position: left lateral decubitus Needle gauge: 22 Needle length: 3.5 in Number of attempts: 2 Opening pressure: 20.5 cm H2O Fluid appearance: clear Tubes of fluid: 4 Total volume: 8 ml Post-procedure: site cleaned and adhesive bandage applied Patient tolerance: Patient tolerated the procedure well with no immediate complications  CSF has resulted showing greater than 600 WBCs in tube #3 consistent with meningitis, viral versus bacterial. Many more lymphocytes consistent more with viral meningitis. No organisms seen on Gram stain. We will start the patient on Rocephin, check blood cultures, continue IV antibiotics until CSF cultures have resulted.  CRITICAL CARE Performed by: PHarvest Dark  Total critical care time: 30 minutes  Critical care time was exclusive of separately billable procedures and treating other patients.  Critical care was necessary to treat or prevent imminent or life-threatening deterioration.  Critical care was time spent personally by me on the following activities: development of treatment plan with patient and/or surrogate as well as nursing, discussions with consultants, evaluation of patient's response to treatment, examination of patient, obtaining history from patient or surrogate, ordering and performing treatments and interventions, ordering and review of laboratory studies, ordering and review of radiographic studies, pulse oximetry and re-evaluation of patient's condition.    Admitting physician has spoken to ID (Ola Spurr who does not recommend antibiotics at this time.  We  will hold off given likelihood of viral meningitis.  ID will see the patient in consultation. Pt to be admitted to medicine service. ____________________________________________   FINAL CLINICAL IMPRESSION(S) / ED DIAGNOSES  headache Fever Meningitis   Harvest Dark, MD 06/08/16 1133     Harvest Dark, MD 06/08/16 [id number]

## 2016-06-08 NOTE — ED Triage Notes (Signed)
Pt in via POV, reports being diagnosed viral meningitis yesterday at PCP, was advised to be evaluated for further testing if not feeling any better today.  Pt reports worsening headache, intermittent neck pain.  No immediate distress noted at this time.

## 2016-06-08 NOTE — Consult Note (Signed)
[facility] Infectious Disease     Reason for Consult:viral meningitis    Referring Physician: Nicholes Mango Date of Admission:  06/08/2016   Active Problems:   Viral meningitis   HPI: Brett Stephenson is a 31 y.o. male admitted with HA and low grade fevers x 4-5 days.  Had seen PCP a few days prior and given tamiflu (flu test neg) as well as ultram. Developed some nausea and neck soreness as well.  On admit no fever, wbc 5 and plts low at 86. LP showed elevated wbc with lympocytic predominance.  Flu PCR neg, HIV neg. No recent travel, has pet dogs but no ticks recently. No sick contacts. Lives wit his wife. Sexually active with his wife only.   Past Medical History:  Diagnosis Date  . Migraine    History reviewed. No pertinent surgical history. Social History  Substance Use Topics  . Smoking status: Never Smoker  . Smokeless tobacco: Never Used  . Alcohol use 3.0 oz/week    5 Glasses of wine per week   Family History  Problem Relation Age of Onset  . Adopted: Yes    Allergies: No Known Allergies  Current antibiotics: Antibiotics Given (last 72 hours)    None      MEDICATIONS: . enoxaparin (LOVENOX) injection  40 mg Subcutaneous Q24H  . senna  1 tablet Oral BID    Review of Systems - 11 systems reviewed and negative per HPI   OBJECTIVE: Temp:  [97.6 F (36.4 C)-98.4 F (36.9 C)] 98.4 F (36.9 C) (01/26 1303) Pulse Rate:  [51-74] 70 (01/26 1303) Resp:  [16-20] 18 (01/26 1303) BP: (108-127)/(61-78) 127/68 (01/26 1303) SpO2:  [99 %-100 %] 100 % (01/26 1303) Weight:  [79.4 kg (175 lb)] 79.4 kg (175 lb) (01/26 0744) Physical Exam  Constitutional: He is oriented to person, place, and time. He appears well-developed and well-nourished. No distress.  HENT: anicteric, perrla Neck mildly stiff.  Mouth/Throat: Oropharynx is clear and moist. No oropharyngeal exudate.  Cardiovascular: Normal rate, regular rhythm and normal heart sounds. Exam reveals no gallop and no  friction rub. No murmur heard.  Pulmonary/Chest: Effort normal and breath sounds normal. No respiratory distress. He has no wheezes.  Abdominal: Soft. Bowel sounds are normal. He exhibits no distension. There is no tenderness.  Lymphadenopathy:  He has no cervical adenopathy.  Neurological: He is alert and oriented to person, place, and time.  Skin: Skin is warm and dry. No rash noted. No erythema.  Psychiatric: He has a normal mood and affect. His behavior is normal.   LABS: Results for orders placed or performed during the hospital encounter of 06/08/16 (from the past 48 hour(s))  CBC with Differential     Status: Abnormal   Collection Time: 06/08/16  8:15 AM  Result Value Ref Range   WBC 5.5 3.8 - 10.6 K/uL   RBC 4.88 4.40 - 5.90 MIL/uL   Hemoglobin 14.3 13.0 - 18.0 g/dL   HCT 41.2 40.0 - 52.0 %   MCV 84.5 80.0 - 100.0 fL   MCH 29.3 26.0 - 34.0 pg   MCHC 34.6 32.0 - 36.0 g/dL   RDW 13.1 11.5 - 14.5 %   Platelets 86 (L) 150 - 440 K/uL   Neutrophils Relative % 68 %   Neutro Abs 3.8 1.4 - 6.5 K/uL   Lymphocytes Relative 17 %   Lymphs Abs 0.9 (L) 1.0 - 3.6 K/uL   Monocytes Relative 12 %   Monocytes Absolute 0.6 0.2 -  1.0 K/uL   Eosinophils Relative 3 %   Eosinophils Absolute 0.1 0 - 0.7 K/uL   Basophils Relative 0 %   Basophils Absolute 0.0 0 - 0.1 K/uL  Comprehensive metabolic panel     Status: Abnormal   Collection Time: 06/08/16  8:15 AM  Result Value Ref Range   Sodium 138 135 - 145 mmol/L   Potassium 3.9 3.5 - 5.1 mmol/L   Chloride 102 101 - 111 mmol/L   CO2 28 22 - 32 mmol/L   Glucose, Bld 94 65 - 99 mg/dL   BUN 13 6 - 20 mg/dL   Creatinine, Ser 1.01 0.61 - 1.24 mg/dL   Calcium 8.9 8.9 - 10.3 mg/dL   Total Protein 7.3 6.5 - 8.1 g/dL   Albumin 4.7 3.5 - 5.0 g/dL   AST 14 (L) 15 - 41 U/L   ALT 17 17 - 63 U/L   Alkaline Phosphatase 36 (L) 38 - 126 U/L   Total Bilirubin 0.9 0.3 - 1.2 mg/dL   GFR calc non Af Amer >60 >60 mL/min   GFR calc Af Amer >60 >60 mL/min     Comment: (NOTE) The eGFR has been calculated using the CKD EPI equation. This calculation has not been validated in all clinical situations. eGFR's persistently <60 mL/min signify possible Chronic Kidney Disease.    Anion gap 8 5 - 15  Sedimentation rate     Status: None   Collection Time: 06/08/16  8:15 AM  Result Value Ref Range   Sed Rate 2 0 - 15 mm/hr  Rapid HIV screen (HIV 1/2 Ab+Ag)     Status: None   Collection Time: 06/08/16  8:22 AM  Result Value Ref Range   HIV-1 P24 Antigen - HIV24 NON REACTIVE NON REACTIVE   HIV 1/2 Antibodies NON REACTIVE NON REACTIVE   Interpretation (HIV Ag Ab)      A non reactive test result means that HIV 1 or HIV 2 antibodies and HIV 1 p24 antigen were not detected in the specimen.  CSF cell count with differential collection tube #: 1     Status: Abnormal   Collection Time: 06/08/16  9:43 AM  Result Value Ref Range   Tube # 1    Color, CSF CLEAR (A) COLORLESS   Appearance, CSF COLORLESS (A) CLEAR   RBC Count, CSF 88 (H) 0 - 3 /cu mm   WBC, CSF 425 (HH) 0 - 5 /cu mm    Comment: CRITICAL RESULT CALLED TO, READ BACK BY AND VERIFIED WITH: Blair Promise @ [id number] 06/08/16 by [facility] [place]    Segmented Neutrophils-CSF 1 %   Lymphs, CSF 94 %   Monocyte-Macrophage-Spinal Fluid 5 %   Eosinophils, CSF 0 %  CSF cell count with differential collection tube #: tube 3     Status: Abnormal   Collection Time: 06/08/16  9:43 AM  Result Value Ref Range   Tube # tube 3    Color, CSF CLEAR (A) COLORLESS   Appearance, CSF COLORLESS (A) CLEAR   RBC Count, CSF 19 (H) 0 - 3 /cu mm   WBC, CSF 610 (HH) 0 - 5 /cu mm    Comment: CRITICAL RESULT CALLED TO, READ BACK BY AND VERIFIED WITH: Blair Promise @ 8469 06/08/16 by [facility]    Segmented Neutrophils-CSF 0 %   Lymphs, CSF 97 %   Monocyte-Macrophage-Spinal Fluid 3 %   Eosinophils, CSF 0 %  Pathologist smear review     Status: None   Collection  Time: 06/08/16  9:43 AM  Result Value Ref Range   Path Review Cytospin slides of  CSF tubes 1 and 3 are reviewed.     Comment: Lymphocytosis with high clinical suspicion of viral meningitis. Reviewed by Dellia Nims Rubinas, M.D.   CSF culture     Status: None (Preliminary result)   Collection Time: 06/08/16  9:44 AM  Result Value Ref Range   Specimen Description LUMBAR    Special Requests NONE    Gram Stain  MANY WBC SEEN NO ORGANISMS SEEN     Culture PENDING    Report Status PENDING   Protein and glucose, CSF     Status: Abnormal   Collection Time: 06/08/16  9:44 AM  Result Value Ref Range   Glucose, CSF 43 40 - 70 mg/dL   Total  Protein, CSF 195 (H) 15 - 45 mg/dL    Comment: RESULT CONFIRMED BY MANUAL DILUTION MNS   No components found for: ESR, C REACTIVE PROTEIN MICRO: Recent Results (from the past 720 hour(s))  CSF culture     Status: None (Preliminary result)   Collection Time: 06/08/16  9:44 AM  Result Value Ref Range Status   Specimen Description LUMBAR  Final   Special Requests NONE  Final   Gram Stain  MANY WBC SEEN NO ORGANISMS SEEN   Final   Culture PENDING  Incomplete   Report Status PENDING  Incomplete    IMAGING: Dg Chest 2 View  Result Date: 06/04/2016 CLINICAL DATA:  Three days of headache fever and chills, recent negative flu chest. No shortness of breath or cough. EXAM: CHEST  2 VIEW COMPARISON:  None in PACs FINDINGS: The lungs are well-expanded and clear. The heart and pulmonary vascularity are normal. The mediastinum is normal in width. There is no pleural effusion. The bony thorax is unremarkable. IMPRESSION: There is no active cardiopulmonary disease. Electronically Signed   By: Ritika Hellickson  [place] M.D.   On: 06/04/2016 07:00    Assessment:   Brett Stephenson is a 31 y.o. male with 4-5 days HA and low grade fevers and neck pain.  CSF WBC 610>425 with 94-97% Lymphs.  Protein 195, Glucose 43.  Most likely viral meningitis - HSV or enteroviral.  Recommendations Hold abx unless worsens Can dc tomorrow if stable and I can see in fu next week  to review culture results and PCRs  Thank you very much for allowing me to participate in the care of this patient. Please call with questions.   Cheral Marker. Ola Spurr, MD

## 2016-06-09 MED ORDER — TRAMADOL HCL 50 MG PO TABS: 50.0000 mg | ORAL_TABLET | Freq: Four times a day (QID) | ORAL | 0 refills | Status: DC | PRN

## 2016-06-09 NOTE — Discharge Summary (Signed)
[facility] [place] at [facility]   PATIENT NAME: Brett Stephenson    MR#:  [id number]  DATE OF BIRTH:  [birth year]  DATE OF ADMISSION:  06/08/2016 ADMITTING PHYSICIAN: Enedina Finner, MD  DATE OF DISCHARGE: 06/09/2016  PRIMARY CARE PHYSICIAN: Margaretann Loveless, PA-C    ADMISSION DIAGNOSIS:  Meningitis [G03.9]  DISCHARGE DIAGNOSIS:  Active Problems:   Viral meningitis   SECONDARY DIAGNOSIS:   Past Medical History:  Diagnosis Date  . Migraine     HOSPITAL COURSE:    31 year old male with history of migraine headaches who presented with severe headache and found to have viral meningitis.  1.. Viral meningitis: Patient was initially placed on empiric antibiotics for meningitis. ID consultation was obtained. CSF fluid appears to be consistent with viral meningitis. His symptoms are slowly improving He will follow-up with Dr. Sampson Goon in one week  DISCHARGE CONDITIONS AND DIET:   Table for discharge on regular diet  CONSULTS OBTAINED:  Treatment Team:  Mick Sell, MD  DRUG ALLERGIES:  No Known Allergies  DISCHARGE MEDICATIONS:   Current Discharge Medication List    START taking these medications   Details  traMADol (ULTRAM) 50 MG tablet Take 1 tablet (50 mg total) by mouth every 6 (six) hours as needed for moderate pain. Qty: 10 tablet, Refills: 0              Today   CHIEF COMPLAINT:   Doing well this morning. Headache seems to come and go. Neck stiffness has improved  VITAL SIGNS:  Blood pressure 122/66, pulse 87, temperature 98.1 F (36.7 C), temperature source Oral, resp. rate 18, height 5\' 10"  (1.778 m), weight 79.4 kg (175 lb), SpO2 100 %.   REVIEW OF SYSTEMS:  Review of Systems  Constitutional: Negative.  Negative for chills, fever and malaise/fatigue.  HENT: Negative.  Negative for ear discharge, ear pain, hearing loss, nosebleeds and sore throat.   Eyes: Negative.  Negative for blurred vision and pain.   Respiratory: Negative.  Negative for cough, hemoptysis, shortness of breath and wheezing.   Cardiovascular: Negative.  Negative for chest pain, palpitations and leg swelling.  Gastrointestinal: Negative.  Negative for abdominal pain, blood in stool, diarrhea, nausea and vomiting.  Genitourinary: Negative.  Negative for dysuria.  Musculoskeletal: Negative.  Negative for back pain.  Skin: Negative.   Neurological: Negative for dizziness, tremors, speech change, focal weakness, seizures and headaches.  Endo/Heme/Allergies: Negative.  Does not bruise/bleed easily.  Psychiatric/Behavioral: Negative.  Negative for depression, hallucinations and suicidal ideas.     PHYSICAL EXAMINATION:  GENERAL:  31 y.o.-year-old patient lying in the bed with no acute distress.  NECK:  Supple, no jugular venous distention. No thyroid enlargement, no tenderness.  LUNGS: Normal breath sounds bilaterally, no wheezing, rales,rhonchi  No use of accessory muscles of respiration.  CARDIOVASCULAR: S1, S2 normal. No murmurs, rubs, or gallops.  ABDOMEN: Soft, non-tender, non-distended. Bowel sounds present. No organomegaly or mass.  EXTREMITIES: No pedal edema, cyanosis, or clubbing.  PSYCHIATRIC: The patient is alert and oriented x 3.  SKIN: No obvious rash, lesion, or ulcer.   DATA REVIEW:   CBC  Recent Labs Lab 06/08/16 0815  WBC 5.5  HGB 14.3  HCT 41.2  PLT 86*    Chemistries   Recent Labs Lab 06/08/16 0815  NA 138  K 3.9  CL 102  CO2 28  GLUCOSE 94  BUN 13  CREATININE 1.01  CALCIUM 8.9  AST 14*  ALT 17  ALKPHOS  36*  BILITOT 0.9    Cardiac Enzymes No results for input(s): TROPONINI in the last 168 hours.  Microbiology Results  @[email or web address] @  RADIOLOGY:  No results found.    Management plans discussed with the patient and he is in agreement. Stable for discharge home  Patient should follow up with Dr. Sampson GoonFitzgerald  CODE STATUS:     Code Status Orders        Start      Ordered   06/08/16 1309  Full code  Continuous     06/08/16 1309    Code Status History    Date Active Date Inactive Code Status Order ID Comments User Context   This patient has a current code status but no historical code status.      TOTAL TIME TAKING CARE OF THIS PATIENT: 37 minutes.    Note: This dictation was prepared with Dragon dictation along with smaller phrase technology. Any transcriptional errors that result from this process are unintentional.  Demarlo Riojas M.D on 06/09/2016 at 10:56 AM  Between 7am to 6pm - Pager - [phone] After 6pm go to [email or web address] - Social research officer, governmentpassword EPAS [email or web address]  [facility] [place] [facility]  Office  [phone]  CC: Primary care physician; Margaretann LovelessJennifer M Burnette, PA-C

## 2016-06-09 NOTE — Progress Notes (Signed)
DISCHARGE NOTE:  Pt given discharge intructions and prescription for Tramadol. Pt advised to make follow up appt. Pt verbalized understanding. Pt wheeled to car by staff.

## 2016-06-10 LAB — HERPES SIMPLEX VIRUS(HSV) DNA BY PCR
HSV 1 DNA: NEGATIVE
HSV 2 DNA: NEGATIVE

## 2016-06-11 LAB — ENTEROVIRUS PCR: ENTEROVIRUS PCR: NEGATIVE

## 2016-06-12 LAB — CSF CULTURE: CULTURE: NO GROWTH

## 2016-06-12 LAB — CSF CULTURE W GRAM STAIN

## 2016-06-13 LAB — CULTURE, BLOOD (ROUTINE X 2)
CULTURE: NO GROWTH
Culture: NO GROWTH

## 2017-08-26 ENCOUNTER — Ambulatory Visit: Payer: BC Managed Care – PPO | Admitting: Family Medicine

## 2017-08-26 ENCOUNTER — Encounter: Payer: Self-pay | Admitting: Family Medicine

## 2017-08-26 VITALS — BP 110/70 | HR 58 | Temp 97.4°F | Resp 16 | Wt 179.6 lb

## 2017-08-26 DIAGNOSIS — J069 Acute upper respiratory infection, unspecified: Secondary | ICD-10-CM | POA: Diagnosis not present

## 2017-08-26 MED ORDER — HYDROCODONE-HOMATROPINE 5-1.5 MG/5ML PO SYRP: 5.0000 mL | ORAL_SOLUTION | Freq: Four times a day (QID) | ORAL | 0 refills | Status: AC | PRN

## 2017-08-26 NOTE — Patient Instructions (Signed)
Continue Dayquil and start cough syrup at night. May also try Delsym for cough.

## 2017-08-26 NOTE — Progress Notes (Signed)
Subjective:     Patient ID: Brett Stephenson, male   DOB: 05/25/1985, 32 y.o.   MRN: [id number] Chief Complaint  Patient presents with  . Cough    Patient comes in office today with complaints of cough and congestion for the past 5 days. Patient reports symptoms of lower back pain and headache associated. Patient has tried otc Zyrtec and Day/Nyquil.    HPI Reports ear congestion with sinus drainae controlled by current otc medication. States cough is keeping him up at night.  Review of Systems  Constitutional: Negative for chills and fever.       Objective:   Physical Exam  Constitutional: He appears well-developed and well-nourished. No distress.  Ears: T.M's intact without inflammation Throat: no tonsillar enlargement or exudate Neck: no cervical adenopathy Lungs: clear     Assessment:    1. URI, acute - HYDROcodone-homatropine (HYCODAN) 5-1.5 MG/5ML syrup; Take 5 mLs by mouth every 6 (six) hours as needed for up to 5 days. 5 ml 4-6 hours as needed for cough  Dispense: 100 mL; Refill: 0    Plan:    Discussed continued use of otc medication.

## 2018-11-27 ENCOUNTER — Other Ambulatory Visit: Payer: Self-pay

## 2018-11-27 DIAGNOSIS — Z20822 Contact with and (suspected) exposure to covid-19: Secondary | ICD-10-CM

## 2018-12-03 LAB — NOVEL CORONAVIRUS, NAA: SARS-CoV-2, NAA: NOT DETECTED

## 2018-12-25 NOTE — Progress Notes (Signed)
Patient: Brett Stephenson Male    DOB: 04/03/1986   33 y.o.   MRN: [id number] Visit Date: 12/26/2018  Today's Provider: Margaretann LovelessJennifer M Thaddus Mcdowell, PA-C   Chief Complaint  Patient presents with   Migraine   Subjective:    I,Brett Stephenson,RMA am acting as a Neurosurgeonscribe for P[employer] IndustriesJennifer M. Arihant Pennings, PA-C.  Virtual Visit via Video Note  I connected with Brett Stephenson on 12/26/18 at  8:40 AM EDT by a video enabled telemedicine application and verified that I am speaking with the correct person using two identifiers.  Location: Patient: Home Provider: [facility]   I discussed the limitations of evaluation and management by telemedicine and the availability of in person appointments. The patient expressed understanding and agreed to proceed.   Migraine  This is a chronic problem. The current episode started in the past 7 days (On Wednesday). The problem occurs monthly. The problem has been unchanged. The pain is located in the temporal (from temporal to the back of his head) region. The pain radiates to the face. The pain quality is similar to prior headaches. The quality of the pain is described as squeezing and pulsating. The pain is at a severity of 8/10 (on Wednesday). The pain is moderate. Associated symptoms include nausea and photophobia. The symptoms are aggravated by noise and bright light. He has tried darkened room (Feels like the Imitrex is not working anymore) for the symptoms. The treatment provided no relief. His past medical history is significant for migraine headaches.   Has been using Imitrex 100mg  without complete resolution or benefit.   No Known Allergies  No current outpatient medications on file.  Review of Systems  Constitutional: Negative.   Eyes: Positive for photophobia.  Respiratory: Negative.   Cardiovascular: Negative.   Gastrointestinal: Positive for nausea.  Neurological: Positive for headaches.    Social History   Tobacco Use   Smoking status: Never Smoker     Smokeless tobacco: Never Used  Substance Use Topics   Alcohol use: Yes    Alcohol/week: 5.0 standard drinks    Types: 5 Glasses of wine per week      Objective:   Wt 180 lb (81.6 kg)    BMI 25.83 kg/m  Vitals:   12/26/18 0822  Weight: 180 lb (81.6 kg)     Physical Exam Vitals signs reviewed.  Constitutional:      General: He is not in acute distress.    Appearance: Normal appearance. He is well-developed.  HENT:     Head: Normocephalic and atraumatic.  Eyes:     Conjunctiva/sclera: Conjunctivae normal.  Neck:     Musculoskeletal: Normal range of motion and neck supple.  Pulmonary:     Effort: Pulmonary effort is normal. No respiratory distress.  Neurological:     Mental Status: He is alert.  Psychiatric:        Mood and Affect: Mood normal.        Behavior: Behavior normal.        Thought Content: Thought content normal.        Judgment: Judgment normal.      No results found for any visits on 12/26/18.     Assessment & Plan     1. Migraine with aura and without status migrainosus, not intractable Change therapy to Maxalt as below. Stop Imitrex. Call if not improving. Discussed changing to Nurtec if fails Maxalt. Has failed Imitrex and Relpax.  - rizatriptan (MAXALT) 10 MG tablet; Take 1  tablet (10 mg total) by mouth as needed for migraine. May repeat in 2 hours if needed  Dispense: 10 tablet; Refill: 5  I discussed the assessment and treatment plan with the patient. The patient was provided an opportunity to ask questions and all were answered. The patient agreed with the plan and demonstrated an understanding of the instructions.   The patient was advised to call back or seek an in-person evaluation if the symptoms worsen or if the condition fails to improve as anticipated.  I provided 13 minutes of non-face-to-face time during this encounter.     Mar Daring, PA-C  [facility]

## 2018-12-26 ENCOUNTER — Other Ambulatory Visit: Payer: Self-pay

## 2018-12-26 ENCOUNTER — Encounter: Payer: Self-pay | Admitting: Physician Assistant

## 2018-12-26 ENCOUNTER — Ambulatory Visit (INDEPENDENT_AMBULATORY_CARE_PROVIDER_SITE_OTHER): Payer: BC Managed Care – PPO | Admitting: Physician Assistant

## 2018-12-26 VITALS — Wt 180.0 lb

## 2018-12-26 DIAGNOSIS — G43109 Migraine with aura, not intractable, without status migrainosus: Secondary | ICD-10-CM | POA: Diagnosis not present

## 2018-12-26 MED ORDER — RIZATRIPTAN BENZOATE 10 MG PO TABS: 10.0000 mg | ORAL_TABLET | ORAL | 5 refills | Status: AC | PRN

## 2018-12-26 NOTE — Patient Instructions (Signed)
Rizatriptan tablets What is this medicine? RIZATRIPTAN (rye za TRIP tan) is used to treat migraines with or without aura. An aura is a strange feeling or visual disturbance that warns you of an attack. It is not used to prevent migraines. This medicine may be used for other purposes; ask your health care provider or pharmacist if you have questions. COMMON BRAND NAME(S): Maxalt What should I tell my health care provider before I take this medicine? They need to know if you have any of these conditions:  cigarette smoker  circulation problems in fingers and toes  diabetes  heart disease  high blood pressure  high cholesterol  history of irregular heartbeat  history of stroke  kidney disease  liver disease  stomach or intestine problems  an unusual or allergic reaction to rizatriptan, other medicines, foods, dyes, or preservatives  pregnant or trying to get pregnant  breast-feeding How should I use this medicine? Take this medicine by mouth with a glass of water. Follow the directions on the prescription label. Do not take it more often than directed. Talk to your pediatrician regarding the use of this medicine in children. While this drug may be prescribed for children as young as 6 years for selected conditions, precautions do apply. Overdosage: If you think you have taken too much of this medicine contact a poison control center or emergency room at once. NOTE: This medicine is only for you. Do not share this medicine with others. What if I miss a dose? This does not apply. This medicine is not for regular use. What may interact with this medicine? Do not take this medicine with any of the following medicines:  certain medicines for migraine headache like almotriptan, eletriptan, frovatriptan, naratriptan, rizatriptan, sumatriptan, zolmitriptan  ergot alkaloids like dihydroergotamine, ergonovine, ergotamine, methylergonovine  MAOIs like Carbex, Eldepryl, Marplan,  Nardil, and Parnate This medicine may also interact with the following medications:  certain medicines for depression, anxiety, or psychotic disorders  propranolol This list may not describe all possible interactions. Give your health care provider a list of all the medicines, herbs, non-prescription drugs, or dietary supplements you use. Also tell them if you smoke, drink alcohol, or use illegal drugs. Some items may interact with your medicine. What should I watch for while using this medicine? Visit your healthcare professional for regular checks on your progress. Tell your healthcare professional if your symptoms do not start to get better or if they get worse. You may get drowsy or dizzy. Do not drive, use machinery, or do anything that needs mental alertness until you know how this medicine affects you. Do not stand up or sit up quickly, especially if you are an older patient. This reduces the risk of dizzy or fainting spells. Alcohol may interfere with the effect of this medicine. Your mouth may get dry. Chewing sugarless gum or sucking hard candy and drinking plenty of water may help. Contact your healthcare professional if the problem does not go away or is severe. If you take migraine medicines for 10 or more days a month, your migraines may get worse. Keep a diary of headache days and medicine use. Contact your healthcare professional if your migraine attacks occur more frequently. What side effects may I notice from receiving this medicine? Side effects that you should report to your doctor or health care professional as soon as possible:  allergic reactions like skin rash, itching or hives, swelling of the face, lips, or tongue  chest pain or chest tightness  signs and symptoms of a dangerous change in heartbeat or heart rhythm like chest pain; dizziness; fast, irregular heartbeat; palpitations; feeling faint or lightheaded; falls; breathing problems  signs and symptoms of a stroke  like changes in vision; confusion; trouble speaking or understanding; severe headaches; sudden numbness or weakness of the face, arm or leg; trouble walking; dizziness; loss of balance or coordination  signs and symptoms of serotonin syndrome like irritable; confusion; diarrhea; fast or irregular heartbeat; muscle twitching; stiff muscles; trouble walking; sweating; high fever; seizures; chills; vomiting Side effects that usually do not require medical attention (report to your doctor or health care professional if they continue or are bothersome):  diarrhea  dizziness  drowsiness  dry mouth  headache  nausea, vomiting  pain, tingling, numbness in the hands or feet  stomach pain This list may not describe all possible side effects. Call your doctor for medical advice about side effects. You may report side effects to FDA at 1-800-FDA-1088. Where should I keep my medicine? Keep out of the reach of children. Store at room temperature between 15 and 30 degrees C (59 and 86 degrees F). Keep container tightly closed. Throw away any unused medicine after the expiration date. NOTE: This sheet is a summary. It may not cover all possible information. If you have questions about this medicine, talk to your doctor, pharmacist, or health care provider.  2020 Elsevier/Gold Standard (2017-11-12 14:59:59)  

## 2019-02-16 ENCOUNTER — Telehealth (INDEPENDENT_AMBULATORY_CARE_PROVIDER_SITE_OTHER): Payer: BC Managed Care – PPO | Admitting: Physician Assistant

## 2019-02-16 ENCOUNTER — Other Ambulatory Visit: Payer: Self-pay

## 2019-02-16 ENCOUNTER — Encounter: Payer: Self-pay | Admitting: Physician Assistant

## 2019-02-16 VITALS — Ht 70.0 in | Wt 175.0 lb

## 2019-02-16 DIAGNOSIS — R002 Palpitations: Secondary | ICD-10-CM | POA: Diagnosis not present

## 2019-02-16 MED ORDER — PROPRANOLOL HCL 20 MG PO TABS: 20.0000 mg | ORAL_TABLET | Freq: Three times a day (TID) | ORAL | 0 refills | Status: DC | PRN

## 2019-02-16 NOTE — Patient Instructions (Signed)

## 2019-02-16 NOTE — Progress Notes (Signed)
Patient: Brett Stephenson Male    DOB: Jun 01, 1985   33 y.o.   MRN: [id number] Visit Date: 02/16/2019  Today's Provider: Mar Daring, PA-C   Chief Complaint  Patient presents with  . Palpitations   Subjective:    I,[name],RMA am acting as a Education administrator for [employer], PA-C.  Virtual Visit via Video Note  I connected with Paticia Stack on 02/16/19 at  3:40 PM EDT by a video enabled telemedicine application and verified that I am speaking with the correct person using two identifiers.  Location: Patient: Home Provider: Home   I discussed the limitations of evaluation and management by telemedicine and the availability of in person appointments. The patient expressed understanding and agreed to proceed.  HPI  Patient with c/o palpitations daily for the last 3 weeks. He denies any increased stress or anxiety. Has had a life change in march, child was born. Denies any excessive worry. Reports sleeping well, getting at least 8 hours. Does have 2 cups of coffee per day, but has for a long time. Does drink alcohol and reports his alcohol consumption has increased to 2 glasses of wine per night on average since Covid started. When palpitations occur they last less than 10-15 seconds. Only once has it lasted closer to a minute. He does have a smart watch but never checked his HR during those times. He denies any chest pain, pressure, heaviness, SOB, DOE, leg swelling or dizziness. He does not know cardiac family history as he was adopted.   No Known Allergies   Current Outpatient Medications:  .  rizatriptan (MAXALT) 10 MG tablet, Take 1 tablet (10 mg total) by mouth as needed for migraine. May repeat in 2 hours if needed, Disp: 10 tablet, Rfl: 5  Review of Systems  Constitutional: Negative.   HENT: Negative.   Respiratory: Negative.   Cardiovascular: Positive for palpitations. Negative for chest pain and leg swelling.  Musculoskeletal: Negative for back pain.   Neurological: Negative for dizziness, weakness, numbness and headaches.    Social History   Tobacco Use  . Smoking status: Never Smoker  . Smokeless tobacco: Never Used  Substance Use Topics  . Alcohol use: Yes    Alcohol/week: 5.0 standard drinks    Types: 5 Glasses of wine per week      Objective:   Ht 5\' 10"  (1.778 m)   Wt 175 lb (79.4 kg)   BMI 25.11 kg/m  Vitals:   02/16/19 1357  Weight: 175 lb (79.4 kg)  Height: 5\' 10"  (1.778 m)  Body mass index is 25.11 kg/m.   Physical Exam Vitals signs reviewed.  Constitutional:      General: He is not in acute distress.    Appearance: Normal appearance. He is well-developed. He is not ill-appearing.  HENT:     Head: Normocephalic and atraumatic.  Eyes:     General: No scleral icterus.    Extraocular Movements: Extraocular movements intact.     Conjunctiva/sclera: Conjunctivae normal.  Neck:     Musculoskeletal: Normal range of motion and neck supple.  Pulmonary:     Effort: Pulmonary effort is normal. No respiratory distress.  Neurological:     Mental Status: He is alert.  Psychiatric:        Mood and Affect: Mood normal.        Behavior: Behavior normal.        Thought Content: Thought content normal.  Judgment: Judgment normal.      No results found for any visits on 02/16/19.     Assessment & Plan     1. Palpitations EKG today shows NSR with an RSR in V1 rate of 69 personally reviewed by me. May benefit by decreasing alcohol and caffeine consumption. Referral placed to cardiology for further evaluation. Propranolol prescribed as below for prn use with palpitations. Advised of fatigue with beta blockers. Call if worsening in the meantime.  - EKG 12-Lead - Ambulatory referral to Cardiology - propranolol (INDERAL) 20 MG tablet; Take 1 tablet (20 mg total) by mouth 3 (three) times daily as needed.  Dispense: 30 tablet; Refill: 0   I discussed the assessment and treatment plan with the patient. The  patient was provided an opportunity to ask questions and all were answered. The patient agreed with the plan and demonstrated an understanding of the instructions.   The patient was advised to call back or seek an in-person evaluation if the symptoms worsen or if the condition fails to improve as anticipated.  I provided 16 minutes of non-face-to-face time during this encounter.    Margaretann Loveless, PA-C  [facility]

## 2019-02-20 ENCOUNTER — Other Ambulatory Visit: Payer: Self-pay

## 2019-02-20 ENCOUNTER — Ambulatory Visit (INDEPENDENT_AMBULATORY_CARE_PROVIDER_SITE_OTHER): Payer: BC Managed Care – PPO | Admitting: Cardiology

## 2019-02-20 ENCOUNTER — Encounter: Payer: Self-pay | Admitting: Cardiology

## 2019-02-20 ENCOUNTER — Ambulatory Visit (INDEPENDENT_AMBULATORY_CARE_PROVIDER_SITE_OTHER): Payer: BC Managed Care – PPO

## 2019-02-20 VITALS — BP 120/60 | HR 63 | Ht 70.0 in | Wt 183.0 lb

## 2019-02-20 DIAGNOSIS — R002 Palpitations: Secondary | ICD-10-CM

## 2019-02-20 NOTE — Progress Notes (Signed)
Cardiology Office Note:    Date:  02/20/2019   ID:  Brett Stephenson, DOB 22-May-1985, MRN [id number]  PCP:  Margaretann Loveless, PA-C  Cardiologist:  Debbe Odea, MD  Electrophysiologist:  None   Referring MD: Suella Grove*   Chief Complaint  Patient presents with   New Patient (Initial Visit)    referred by PCP for palpitations. Meds reviewed verbally with patient.     History of Present Illness:    Brett Stephenson is a 33 y.o. male with a hx of migraines, who presents to the clinic due to palpitations x2 weeks.  Patient denies symptoms prior to this.  He states having palpitations every day occurring about 1-2 times a day, and lasts for maybe 5 seconds.  Couple of days ago he had an episode which lasted about 30 seconds.  He called his primary care provider who prescribed him propranolol to take when he has palpitations.  He has not taking the propanolol yet.  He states drinking about 2 cups of coffee a day for at least 10 years, drinks about 1 to 2 glasses of wine in the evening.  He denies any chest pains, dizziness, presyncope or syncope.  He denies edema, orthopnea.  He is not sure of his family history because he was adopted.  He denies drinking energy drinks.   Past Medical History:  Diagnosis Date   Migraine     History reviewed. No pertinent surgical history.  Current Medications: Current Meds  Medication Sig   propranolol (INDERAL) 20 MG tablet Take 1 tablet (20 mg total) by mouth 3 (three) times daily as needed.   rizatriptan (MAXALT) 10 MG tablet Take 1 tablet (10 mg total) by mouth as needed for migraine. May repeat in 2 hours if needed     Allergies:   Patient has no known allergies.   Social History   Socioeconomic History   Marital status: Married    Spouse name: Not on file   Number of children: Not on file   Years of education: Not on file   Highest education level: Not on file  Occupational History   Occupation: fulltime  Social  Needs   Financial resource strain: Not on file   Food insecurity    Worry: Not on file    Inability: Not on file   Transportation needs    Medical: Not on file    Non-medical: Not on file  Tobacco Use   Smoking status: Never Smoker   Smokeless tobacco: Never Used  Substance and Sexual Activity   Alcohol use: Yes    Alcohol/week: 5.0 standard drinks    Types: 5 Glasses of wine per week   Drug use: No   Sexual activity: Not on file  Lifestyle   Physical activity    Days per week: Not on file    Minutes per session: Not on file   Stress: Not on file  Relationships   Social connections    Talks on phone: Not on file    Gets together: Not on file    Attends religious service: Not on file    Active member of club or organization: Not on file    Attends meetings of clubs or organizations: Not on file    Relationship status: Not on file  Other Topics Concern   Not on file  Social History Narrative   Not on file     Family History: The patient's family history is not on file. He was  adopted.  ROS:   Please see the history of present illness.     All other systems reviewed and are negative.  EKGs/Labs/Other Studies Reviewed:    The following studies were reviewed today:   EKG:  EKG is  ordered today.  The ekg ordered today demonstrates normal sinus rhythm, normal ECG.  Recent Labs: No results found for requested labs within last 8760 hours.  Recent Lipid Panel No results found for: CHOL, TRIG, HDL, CHOLHDL, VLDL, LDLCALC, LDLDIRECT  Physical Exam:    VS:  BP 120/60 (BP Location: Right Arm, Patient Position: Sitting, Cuff Size: Normal)    Pulse 63    Ht 5\' 10"  (1.778 m)    Wt 183 lb (83 kg)    BMI 26.26 kg/m     Wt Readings from Last 3 Encounters:  02/20/19 183 lb (83 kg)  02/16/19 175 lb (79.4 kg)  12/26/18 180 lb (81.6 kg)     GEN:  Well nourished, well developed in no acute distress HEENT: Normal NECK: No JVD; No carotid bruits LYMPHATICS: No  lymphadenopathy CARDIAC: RRR, no murmurs, rubs, gallops RESPIRATORY:  Clear to auscultation without rales, wheezing or rhonchi  ABDOMEN: Soft, non-tender, non-distended MUSCULOSKELETAL:  No edema; No deformity  SKIN: Warm and dry NEUROLOGIC:  Alert and oriented x 3 PSYCHIATRIC:  Normal affect   ASSESSMENT:    1. Palpitations    PLAN:    In order of problems listed above:  1. Get ZIO patch x2 weeks.  Patient advised to cut down on caffeine intake.  Follow-up in about 4 to 6 weeks.   Medication Adjustments/Labs and Tests Ordered: Current medicines are reviewed at length with the patient today.  Concerns regarding medicines are outlined above.  Orders Placed This Encounter  Procedures   LONG TERM MONITOR (3-14 DAYS)   EKG 12-Lead   No orders of the defined types were placed in this encounter.   Patient Instructions  Medication Instructions:  - no changes  If you need a refill on your cardiac medications before your next appointment, please call your pharmacy.   Lab work: - none ordered  If you have labs (blood work) drawn today and your tests are completely normal, you will receive your results only by:  [place] (if you have MyChart) OR  A paper copy in the mail If you have any lab test that is abnormal or we need to change your treatment, we will call you to review the results.  Testing/Procedures: - Your physician has recommended that you wear a 14- day Zio monitor. This monitor is a medical device that records the hearts electrical activity. Doctors most often use these monitors to diagnose arrhythmias. Arrhythmias are problems with the speed or rhythm of the heartbeat. The monitor is a small device applied to your chest. You can wear one while you do your normal daily activities. While wearing this monitor if you have any symptoms to push the button and record what you felt. Once you have worn this monitor for the period of time provider prescribed  (Usually 14 days), you will return the monitor device in the postage paid box. Once it is returned they will download the data collected and provide [place] with a report which the provider will then review and we will call you with those results. Important tips:  1. Avoid showering during the first 24 hours of wearing the monitor. 2. Avoid excessive sweating to help maximize wear time. 3. Do not submerge the device, no  hot tubs, and no swimming pools. 4. Keep any lotions or oils away from the patch. 5. After 24 hours you may shower with the patch on. Take brief showers with your back facing the shower head.  6. Do not remove patch once it has been placed because that will interrupt data and decrease adhesive wear time. 7. Push the button when you have any symptoms and write down what you were feeling. 8. Once you have completed wearing your monitor, remove and place into box which has postage paid and place in your outgoing mailbox.  9. If for some reason you have misplaced your box then call our office and we can provide another box and/or mail it off for you.        Follow-Up: At [facility], you and your health needs are our priority.  As part of our continuing mission to provide you with exceptional heart care, we have created designated Provider Care Teams.  These Care Teams include your primary Cardiologist (physician) and Advanced Practice Providers (APPs -  Physician Assistants and Nurse Practitioners) who all work together to provide you with the care you need, when you need it.  in 4-6 weeks with Dr. Azucena CecilAgbor-Etang  Any Other Special Instructions Will Be Listed Below (If Applicable). - N/A      Signed, Debbe OdeaBrian Agbor-Etang, MD  02/20/2019 10:02 AM    [facility]

## 2019-02-20 NOTE — Patient Instructions (Signed)
Medication Instructions:  - no changes  If you need a refill on your cardiac medications before your next appointment, please call your pharmacy.   Lab work: - none ordered  If you have labs (blood work) drawn today and your tests are completely normal, you will receive your results only by: [place] Kitchen MyChart Message (if you have MyChart) OR . A paper copy in the mail If you have any lab test that is abnormal or we need to change your treatment, we will call you to review the results.  Testing/Procedures: - Your physician has recommended that you wear a 14- day Zio monitor. This monitor is a medical device that records the heart's electrical activity. Doctors most often use these monitors to diagnose arrhythmias. Arrhythmias are problems with the speed or rhythm of the heartbeat. The monitor is a small device applied to your chest. You can wear one while you do your normal daily activities. While wearing this monitor if you have any symptoms to push the button and record what you felt. Once you have worn this monitor for the period of time provider prescribed (Usually 14 days), you will return the monitor device in the postage paid box. Once it is returned they will download the data collected and provide [place] with a report which the provider will then review and we will call you with those results. Important tips:  1. Avoid showering during the first 24 hours of wearing the monitor. 2. Avoid excessive sweating to help maximize wear time. 3. Do not submerge the device, no hot tubs, and no swimming pools. 4. Keep any lotions or oils away from the patch. 5. After 24 hours you may shower with the patch on. Take brief showers with your back facing the shower head.  6. Do not remove patch once it has been placed because that will interrupt data and decrease adhesive wear time. 7. Push the button when you have any symptoms and write down what you were feeling. 8. Once you have completed wearing your monitor,  remove and place into box which has postage paid and place in your outgoing mailbox.  9. If for some reason you have misplaced your box then call our office and we can provide another box and/or mail it off for you.        Follow-Up: At [facility], you and your health needs are our priority.  As part of our continuing mission to provide you with exceptional heart care, we have created designated Provider Care Teams.  These Care Teams include your primary Cardiologist (physician) and Advanced Practice Providers (APPs -  Physician Assistants and Nurse Practitioners) who all work together to provide you with the care you need, when you need it. . in 4-6 weeks with Dr. Garen Lah  Any Other Special Instructions Will Be Listed Below (If Applicable). - N/A

## 2019-03-12 ENCOUNTER — Other Ambulatory Visit: Payer: Self-pay | Admitting: Physician Assistant

## 2019-03-12 DIAGNOSIS — R002 Palpitations: Secondary | ICD-10-CM

## 2019-03-31 ENCOUNTER — Ambulatory Visit: Payer: BC Managed Care – PPO | Admitting: Cardiology

## 2019-04-02 ENCOUNTER — Encounter: Payer: Self-pay | Admitting: Cardiology

## 2019-04-02 ENCOUNTER — Ambulatory Visit (INDEPENDENT_AMBULATORY_CARE_PROVIDER_SITE_OTHER): Payer: BC Managed Care – PPO | Admitting: Cardiology

## 2019-04-02 ENCOUNTER — Other Ambulatory Visit: Payer: Self-pay

## 2019-04-02 VITALS — BP 100/60 | HR 60 | Ht 70.0 in | Wt 191.5 lb

## 2019-04-02 DIAGNOSIS — R002 Palpitations: Secondary | ICD-10-CM

## 2019-04-02 NOTE — Patient Instructions (Signed)
Medication Instructions:  None *If you need a refill on your cardiac medications before your next appointment, please call your pharmacy*  Lab Work: None If you have labs (blood work) drawn today and your tests are completely normal, you will receive your results only by: . MyChart Message (if you have MyChart) OR . A paper copy in the mail If you have any lab test that is abnormal or we need to change your treatment, we will call you to review the results.  Testing/Procedures: None  Follow-Up: At [facility], you and your health needs are our priority.  As part of our continuing mission to provide you with exceptional heart care, we have created designated Provider Care Teams.  These Care Teams include your primary Cardiologist (physician) and Advanced Practice Providers (APPs -  Physician Assistants and Nurse Practitioners) who all work together to provide you with the care you need, when you need it.  Your next appointment:   As needed  

## 2019-04-02 NOTE — Progress Notes (Signed)
Cardiology Office Note:    Date:  04/02/2019   ID:  Brett Stephenson, DOB 09/25/1985, MRN [id number]  PCP:  Margaretann LovelessBurnette, Jennifer M, PA-C  Cardiologist:  Debbe OdeaBrian Agbor-Etang, MD  Electrophysiologist:  None   Referring MD: Suella GroveBurnette, Jennifer M, P*   Chief Complaint  Patient presents with  . other    4-6 wk f/u no complaints today. Meds reviewed verbally with pt.    History of Present Illness:    Brett Stephenson is a 33 y.o. male with a hx of migraines, who presents to the clinic for 6-week follow-up.  Patient was originally seen due to palpitations x2 weeks.    At the time, patient denies symptoms prior to this.  He states having palpitations every day occurring about 1-2 times a day, and lasts for maybe 5 seconds.  Couple of days ago he had an episode which lasted about 30 seconds.  He called his primary care provider who prescribed him propranolol to take when he has palpitations.  He has not taking the propanolol yet.  He states drinking about 2 cups of coffee a day for at least 10 years, drinks about 1 to 2 glasses of wine in the evening.  He denies any chest pains, dizziness, presyncope or syncope.  He denies edema, orthopnea.  He is not sure of his family history because he was adopted.  He denies drinking energy drinks.  Zio patch was ordered.  Patient states cutting back on his caffeine with marked improvement in his symptoms.   Past Medical History:  Diagnosis Date  . Migraine     History reviewed. No pertinent surgical history.  Current Medications: Current Meds  Medication Sig  . propranolol (INDERAL) 20 MG tablet TAKE 1 TABLET (20 MG TOTAL) BY MOUTH 3 (THREE) TIMES DAILY AS NEEDED.  . rizatriptan (MAXALT) 10 MG tablet Take 1 tablet (10 mg total) by mouth as needed for migraine. May repeat in 2 hours if needed     Allergies:   Patient has no known allergies.   Social History   Socioeconomic History  . Marital status: Married    Spouse name: Not on file  . Number of  children: Not on file  . Years of education: Not on file  . Highest education level: Not on file  Occupational History  . Occupation: fulltime  Social Needs  . Financial resource strain: Not on file  . Food insecurity    Worry: Not on file    Inability: Not on file  . Transportation needs    Medical: Not on file    Non-medical: Not on file  Tobacco Use  . Smoking status: Never Smoker  . Smokeless tobacco: Never Used  Substance and Sexual Activity  . Alcohol use: Yes    Alcohol/week: 5.0 standard drinks    Types: 5 Glasses of wine per week  . Drug use: No  . Sexual activity: Not on file  Lifestyle  . Physical activity    Days per week: Not on file    Minutes per session: Not on file  . Stress: Not on file  Relationships  . Social Musicianconnections    Talks on phone: Not on file    Gets together: Not on file    Attends religious service: Not on file    Active member of club or organization: Not on file    Attends meetings of clubs or organizations: Not on file    Relationship status: Not on file  Other Topics Concern  .  Not on file  Social History Narrative  . Not on file     Family History: The patient's family history is not on file. He was adopted.  ROS:   Please see the history of present illness.     All other systems reviewed and are negative.  EKGs/Labs/Other Studies Reviewed:    The following studies were reviewed today: 2-week cardiac monitor, date 02/20/2019 Patient had a min HR of 40 bpm, max HR of 187 bpm, and avg HR of 70 bpm. Predominant underlying rhythm was Sinus Rhythm. Isolated SVEs were rare (<1.0%), SVE Couplets were rare (<1.0%), and no SVE Triplets were present. Isolated VEs were rare (<1.0%), and no VE Couplets or VE Triplets were present. Patient triggered events were associated with sinus rhythm.  EKG:  EKG is  ordered today.  The ekg ordered today demonstrates normal sinus rhythm, normal ECG.  Recent Labs: No results found for requested  labs within last 8760 hours.  Recent Lipid Panel No results found for: CHOL, TRIG, HDL, CHOLHDL, VLDL, LDLCALC, LDLDIRECT  Physical Exam:    VS:  BP 100/60 (BP Location: Left Arm, Patient Position: Sitting, Cuff Size: Normal)   Pulse 60   Ht 5\' 10"  (1.778 m)   Wt 191 lb 8 oz (86.9 kg)   SpO2 99%   BMI 27.48 kg/m     Wt Readings from Last 3 Encounters:  04/02/19 191 lb 8 oz (86.9 kg)  02/20/19 183 lb (83 kg)  02/16/19 175 lb (79.4 kg)     GEN:  Well nourished, well developed in no acute distress HEENT: Normal NECK: No JVD; No carotid bruits LYMPHATICS: No lymphadenopathy CARDIAC: RRR, no murmurs, rubs, gallops RESPIRATORY:  Clear to auscultation without rales, wheezing or rhonchi  ABDOMEN: Soft, non-tender, non-distended MUSCULOSKELETAL:  No edema; No deformity  SKIN: Warm and dry NEUROLOGIC:  Alert and oriented x 3 PSYCHIATRIC:  Normal affect   ASSESSMENT:   His 2-week cardiac monitor did not show any significant arrhythmias.  Patient triggered events were associated with sinus rhythm.  Clinically, his symptoms have markedly improved since decreasing his caffeine intake. 1. Palpitations    PLAN:    In order of problems listed above:  No significant arrhythmias noted on cardiac monitor.  Essentially a benign monitor result.  Patient reassured.  Follow-up as needed  Medication Adjustments/Labs and Tests Ordered: Current medicines are reviewed at length with the patient today.  Concerns regarding medicines are outlined above.  Orders Placed This Encounter  Procedures  . EKG 12-Lead   No orders of the defined types were placed in this encounter.   Patient Instructions  Medication Instructions:  None *If you need a refill on your cardiac medications before your next appointment, please call your pharmacy*  Lab Work: None If you have labs (blood work) drawn today and your tests are completely normal, you will receive your results only by: Marland Kitchen MyChart Message (if  you have MyChart) OR . A paper copy in the mail If you have any lab test that is abnormal or we need to change your treatment, we will call you to review the results.  Testing/Procedures: None  Follow-Up: At [facility], you and your health needs are our priority.  As part of our continuing mission to provide you with exceptional heart care, we have created designated Provider Care Teams.  These Care Teams include your primary Cardiologist (physician) and Advanced Practice Providers (APPs -  Physician Assistants and Nurse Practitioners) who all work together to  provide you with the care you need, when you need it.  Your next appointment:   As needed       Signed, Debbe Odea, MD  04/02/2019 9:16 AM    [facility]

## 2019-09-11 ENCOUNTER — Ambulatory Visit: Payer: BC Managed Care – PPO | Admitting: Physician Assistant

## 2019-09-11 ENCOUNTER — Other Ambulatory Visit: Payer: Self-pay

## 2019-09-11 ENCOUNTER — Encounter: Payer: Self-pay | Admitting: Physician Assistant

## 2019-09-11 VITALS — BP 104/60 | HR 80 | Temp 97.3°F | Wt 177.0 lb

## 2019-09-11 DIAGNOSIS — M2550 Pain in unspecified joint: Secondary | ICD-10-CM | POA: Diagnosis not present

## 2019-09-11 DIAGNOSIS — W57XXXA Bitten or stung by nonvenomous insect and other nonvenomous arthropods, initial encounter: Secondary | ICD-10-CM | POA: Diagnosis not present

## 2019-09-11 DIAGNOSIS — R5383 Other fatigue: Secondary | ICD-10-CM | POA: Diagnosis not present

## 2019-09-11 MED ORDER — DOXYCYCLINE HYCLATE 100 MG PO TABS: 200.0000 mg | ORAL_TABLET | Freq: Once | ORAL | 0 refills | Status: AC

## 2019-09-11 NOTE — Patient Instructions (Signed)

## 2019-09-11 NOTE — Progress Notes (Signed)
I,Laura E Walsh,acting as a Education administrator for [employer], PA-C.,have documented all relevant documentation on the behalf of Mar Daring, PA-C,as directed by  Mar Daring, PA-C while in the presence of Mar Daring, [place].   Established patient visit   Patient: Brett Stephenson   DOB: [birth year]   34 y.o. Male  MRN: [id number] Visit Date: 09/11/2019  Today's healthcare provider: Mar Daring, PA-C   Chief Complaint  Patient presents with  . Insect Bite    At least one tick in the last month.    Subjective    HPI   Pt comes in today complaining of at least one tick bite.  He believe there may have been more.  The tick that he found was on his right groin about a month ago.  He is now experiencing fatigue, weakness, neck pain/stiffness, and some joint pain.  Pt denies fevers, chills, and rashes.  He does admit to having a few raised red areas from other potential tick bites.     Patient Active Problem List   Diagnosis Date Noted  . Viral meningitis 06/08/2016  . Migraines 11/01/2014   Past Medical History:  Diagnosis Date  . Migraine    Social History   Tobacco Use  . Smoking status: Never Smoker  . Smokeless tobacco: Never Used  Substance Use Topics  . Alcohol use: Yes    Alcohol/week: 5.0 standard drinks    Types: 5 Glasses of wine per week  . Drug use: No   No Known Allergies     Medications: Outpatient Medications Prior to Visit  Medication Sig  . propranolol (INDERAL) 20 MG tablet TAKE 1 TABLET (20 MG TOTAL) BY MOUTH 3 (THREE) TIMES DAILY AS NEEDED.  . rizatriptan (MAXALT) 10 MG tablet Take 1 tablet (10 mg total) by mouth as needed for migraine. May repeat in 2 hours if needed   No facility-administered medications prior to visit.    Review of Systems  Constitutional: Positive for fatigue. Negative for activity change, appetite change, chills, diaphoresis, fever and unexpected weight change.  Respiratory: Negative.     Cardiovascular: Negative.   Gastrointestinal: Negative.   Musculoskeletal: Positive for arthralgias (Especially left knee), neck pain and neck stiffness. Negative for back pain, gait problem, joint swelling and myalgias.  Skin: Positive for color change (Only from the tick bite(s)). Negative for pallor, rash and wound.  Neurological: Positive for weakness and headaches. Negative for dizziness and light-headedness.  Hematological: Negative for adenopathy.      Objective    BP 104/60 (BP Location: Left Arm, Patient Position: Sitting, Cuff Size: Large)   Pulse 80   Temp (!) 97.3 F (36.3 C) (Temporal)   Wt 177 lb (80.3 kg)   SpO2 98%   BMI 25.40 kg/m    Physical Exam Constitutional:      Appearance: Normal appearance.  Skin:    General: Skin is warm and dry.     Comments: Multiple red raise areas on pt's skin likely secondary to tick bites.   Neurological:     Mental Status: He is alert and oriented to person, place, and time. Mental status is at baseline.  Psychiatric:        Mood and Affect: Mood normal.        Behavior: Behavior normal.        Thought Content: Thought content normal.        Judgment: Judgment normal.      No results found  for any visits on 09/11/19.  Assessment & Plan     1. Tick bite, initial encounter Multiple tick in the last month Will check labs to rule out Lyme disease and RMSF Preventative RX for doxycycline until the lab results come back.  Cautioned for symptoms of Alpha-gal syndrome. Advised pt to call if symptoms worsening or not improved.  - doxycycline (VIBRA-TABS) 100 MG tablet; Take 2 tablets (200 mg total) by mouth once for 1 dose.  Dispense: 2 tablet; Refill: 0 - B. burgdorfi antibodies - Rocky mtn spotted fvr abs pnl(IgG+IgM) - CBC with Differential/Platelet - TSH  2. Arthralgia, unspecified joint As above - B. burgdorfi antibodies - Rocky mtn spotted fvr abs pnl(IgG+IgM) - CBC with Differential/Platelet - TSH -  Comprehensive metabolic panel  3. Fatigue, unspecified type As above  - B. burgdorfi antibodies - Rocky mtn spotted fvr abs pnl(IgG+IgM) - CBC with Differential/Platelet - TSH - Comprehensive metabolic panel   Return if symptoms worsen or fail to improve.      Delmer Islam, PA-C, have reviewed all documentation for this visit. The documentation on 09/11/19 for the exam, diagnosis, procedures, and orders are all accurate and complete.   Reine Just  [facility] [phone] (phone) [phone] (fax)  [facility]

## 2019-09-12 LAB — CBC WITH DIFFERENTIAL/PLATELET
Basophils Absolute: 0 10*3/uL (ref 0.0–0.2)
EOS (ABSOLUTE): 0.1 10*3/uL (ref 0.0–0.4)
Immature Grans (Abs): 0 10*3/uL (ref 0.0–0.1)
Lymphocytes Absolute: 1.7 10*3/uL (ref 0.7–3.1)
MCV: 90 fL (ref 79–97)
Monocytes Absolute: 0.4 10*3/uL (ref 0.1–0.9)
Monocytes: 7 %
Neutrophils Absolute: 3.4 10*3/uL (ref 1.4–7.0)
Neutrophils: 60 %
Platelets: 89 10*3/uL — CL (ref 150–450)
RBC: 4.86 x10E6/uL (ref 4.14–5.80)

## 2019-09-12 LAB — COMPREHENSIVE METABOLIC PANEL
Albumin: 5 g/dL (ref 4.0–5.0)
Alkaline Phosphatase: 49 IU/L (ref 39–117)
BUN: 13 mg/dL (ref 6–20)
Bilirubin Total: 0.5 mg/dL (ref 0.0–1.2)
Chloride: 104 mmol/L (ref 96–106)
GFR calc non Af Amer: 96 mL/min/{1.73_m2} (ref >59)
Glucose: 87 mg/dL (ref 65–99)
Potassium: 3.9 mmol/L (ref 3.5–5.2)
Sodium: 143 mmol/L (ref 134–144)

## 2019-09-15 ENCOUNTER — Telehealth: Payer: Self-pay

## 2019-09-15 LAB — CBC WITH DIFFERENTIAL/PLATELET
Basos: 0 %
Eos: 2 %
Hematocrit: 43.6 % (ref 37.5–51.0)
Hemoglobin: 14.1 g/dL (ref 13.0–17.7)
Immature Granulocytes: 0 %
Lymphs: 31 %
MCH: 29 pg (ref 26.6–33.0)
MCHC: 32.3 g/dL (ref 31.5–35.7)
RDW: 12.6 % (ref 11.6–15.4)
WBC: 5.6 10*3/uL (ref 3.4–10.8)

## 2019-09-15 LAB — ROCKY MTN SPOTTED FVR ABS PNL(IGG+IGM)
RMSF IgG: NEGATIVE
RMSF IgM: 0.21 index (ref 0.00–0.89)

## 2019-09-15 LAB — B. BURGDORFI ANTIBODIES: Lyme IgG/IgM Ab: <0.91 {ISR} (ref 0.00–0.90)

## 2019-09-15 LAB — COMPREHENSIVE METABOLIC PANEL
ALT: 34 IU/L (ref 0–44)
AST: 27 IU/L (ref 0–40)
Albumin/Globulin Ratio: 2.2 (ref 1.2–2.2)
BUN/Creatinine Ratio: 13 (ref 9–20)
CO2: 25 mmol/L (ref 20–29)
Calcium: 9.5 mg/dL (ref 8.7–10.2)
Creatinine, Ser: 1.02 mg/dL (ref 0.76–1.27)
GFR calc Af Amer: 111 mL/min/{1.73_m2} (ref >59)
Globulin, Total: 2.3 g/dL (ref 1.5–4.5)
Total Protein: 7.3 g/dL (ref 6.0–8.5)

## 2019-09-15 LAB — TSH: TSH: 0.751 u[IU]/mL (ref 0.450–4.500)

## 2019-09-15 NOTE — Telephone Encounter (Signed)
Called and spoke with patient he reports no fever and states that he has had a headache since Thursday, patient did not feel the need to go to urgent care for evaluation and requested to be seen by provider in the morning. I booked patient on Adriana's schedule for virtual appointment at 8:20AM.KW

## 2019-09-15 NOTE — Telephone Encounter (Signed)
Let patient know he should be covid tested as well given pandemic.   Fever ?  Triage caller and If so schedule virtual appointment today if possible. If headache not relieved with over the counter medication  he should be seen immediately at urgent care.   Advised ER or urgent Care if after hours or on weekend. Call 911 for emergency symptoms at any time.   Covid Testing Instructions for [place]:  If you/your practice has a patient that needs an appointment, please have the patient text "COVID" to [phone], OR they can log on to [email or web address] to easily make an on-line appointment. Please note:  If you have a patient who does not have access to a smart phone or PC, you or the patient can call [phone] to get assistance.

## 2019-09-15 NOTE — Telephone Encounter (Signed)
Brett Stephenson is out today and I did not want this to wait.  Thanks  Copied from CRM [id number]. Topic: General - Other >> Sep 15, 2019 10:32 AM Marylen Ponto wrote: Reason for CRM: Pt stated he was just seen for a tick bite and he was told to call back if the symptoms did not get better. Pt stated he is experiencing severe headache, neck pain, pressure in ears, as well as feeling weak and fatigued. Pt declined to schedule an appt . Pt requested that a message be sent to Joycelyn Man advising her of this and if she feels he needs to schedule an appt then he will schedule an appt. Pt requests call back

## 2019-09-16 ENCOUNTER — Other Ambulatory Visit: Payer: Self-pay

## 2019-09-16 ENCOUNTER — Other Ambulatory Visit
Admission: RE | Admit: 2019-09-16 | Discharge: 2019-09-16 | Disposition: A | Discharge status: Home / Self Care | Payer: BC Managed Care – PPO | Attending: Physician Assistant | Admitting: Physician Assistant

## 2019-09-16 ENCOUNTER — Telehealth (INDEPENDENT_AMBULATORY_CARE_PROVIDER_SITE_OTHER): Payer: BC Managed Care – PPO | Admitting: Physician Assistant

## 2019-09-16 ENCOUNTER — Telehealth: Payer: Self-pay

## 2019-09-16 DIAGNOSIS — W57XXXA Bitten or stung by nonvenomous insect and other nonvenomous arthropods, initial encounter: Secondary | ICD-10-CM

## 2019-09-16 DIAGNOSIS — Z01812 Encounter for preprocedural laboratory examination: Secondary | ICD-10-CM | POA: Insufficient documentation

## 2019-09-16 DIAGNOSIS — R519 Headache, unspecified: Secondary | ICD-10-CM

## 2019-09-16 LAB — COMPREHENSIVE METABOLIC PANEL
ALT: 35 U/L (ref 0–44)
AST: 24 U/L (ref 15–41)
Albumin: 4.9 g/dL (ref 3.5–5.0)
Alkaline Phosphatase: 40 U/L (ref 38–126)
Anion gap: 8 (ref 5–15)
BUN: 14 mg/dL (ref 6–20)
CO2: 27 mmol/L (ref 22–32)
Calcium: 8.9 mg/dL (ref 8.9–10.3)
Chloride: 103 mmol/L (ref 98–111)
Creatinine, Ser: 0.94 mg/dL (ref 0.61–1.24)
GFR calc Af Amer: >60 mL/min (ref >60)
GFR calc non Af Amer: >60 mL/min (ref >60)
Glucose, Bld: 100 mg/dL — ABNORMAL HIGH (ref 70–99)
Potassium: 4.3 mmol/L (ref 3.5–5.1)
Sodium: 138 mmol/L (ref 135–145)
Total Bilirubin: 0.8 mg/dL (ref 0.3–1.2)
Total Protein: 7.5 g/dL (ref 6.5–8.1)

## 2019-09-16 LAB — CBC WITH DIFFERENTIAL/PLATELET
Abs Immature Granulocytes: 0.01 10*3/uL (ref 0.00–0.07)
Basophils Absolute: 0 10*3/uL (ref 0.0–0.1)
Basophils Relative: 1 %
Eosinophils Absolute: 0.1 10*3/uL (ref 0.0–0.5)
Eosinophils Relative: 2 %
HCT: 42.1 % (ref 39.0–52.0)
Hemoglobin: 14.3 g/dL (ref 13.0–17.0)
Immature Granulocytes: 0 %
Lymphocytes Relative: 25 %
Lymphs Abs: 1.3 10*3/uL (ref 0.7–4.0)
MCH: 29.4 pg (ref 26.0–34.0)
MCHC: 34 g/dL (ref 30.0–36.0)
MCV: 86.6 fL (ref 80.0–100.0)
Monocytes Absolute: 0.4 10*3/uL (ref 0.1–1.0)
Monocytes Relative: 8 %
Neutro Abs: 3.2 10*3/uL (ref 1.7–7.7)
Neutrophils Relative %: 64 %
Platelets: 175 10*3/uL (ref 150–400)
RBC: 4.86 MIL/uL (ref 4.22–5.81)
RDW: 12.5 % (ref 11.5–15.5)
WBC: 5 10*3/uL (ref 4.0–10.5)
nRBC: 0 % (ref 0.0–0.2)

## 2019-09-16 NOTE — Telephone Encounter (Signed)
-----   Message from Margaretann Loveless, [place] sent at 09/16/2019 10:09 AM EDT ----- Lyme and rocky mountain spotted fever antibodies were both negative. CBC had low platelets, but it states the blood had clotted so I am not concerned. We can recheck your blood count and platelets in 2 weeks if you desire. Sugar is normal. Kidney and liver function are normal. Sodium, potassium and calcium are normal.

## 2019-09-16 NOTE — Progress Notes (Signed)
MyChart Video Visit    Virtual Visit via Video Note   This visit type was conducted due to national recommendations for restrictions regarding the COVID-19 Pandemic (e.g. social distancing) in an effort to limit this patient's exposure and mitigate transmission in our community. This patient is at least at moderate risk for complications without adequate follow up. This format is felt to be most appropriate for this patient at this time. Physical exam was limited by quality of the video and audio technology used for the visit.   Patient location: home Provider location: office   Patient: Brett Stephenson   DOB: Nov 09, 1985   34 y.o. Male  MRN: [id number] Visit Date: 09/16/2019  Today's healthcare provider: Trinna Post, PA-C   No chief complaint on file.  Subjective    HPI  The patient is a 34 year old male with history of tick bite early April.  He was seen at the end of April and had negative RMSF and Lymes testing.  He was given Doxycycline 100 mg # 2 one time dose.   He states today that he is continuing to have headache and fatigue.  No fever and the site of that bite as well as the others he had at that time are not bothering him.  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  He said that one of the tick bites was on the left side of his neck and that lymph node is swollen but he doesn't think any other ones are swollen. Not having trouble touching his chin to his chest, denies fever. Reports soreness and tightness. Patient has a history of migraines which typically presents with an aura but on occasion will present without one. Denies focal weakness, vision change, trouble speaking. Reports feeling fatigued and run down. Reports he had his second pfizer shot first week of April. Reports taking acetaminophen which has been somewhat helpful.     Medications: Outpatient Medications Prior to Visit  Medication Sig  . propranolol (INDERAL)  20 MG tablet TAKE 1 TABLET (20 MG TOTAL) BY MOUTH 3 (THREE) TIMES DAILY AS NEEDED.  . rizatriptan (MAXALT) 10 MG tablet Take 1 tablet (10 mg total) by mouth as needed for migraine. May repeat in 2 hours if needed   No facility-administered medications prior to visit.      Review of Systems    Objective    There were no vitals taken for this visit.   Physical Exam Constitutional:      General: He is not in acute distress.    Appearance: He is well-developed. He is not ill-appearing, toxic-appearing or diaphoretic.  Pulmonary:     Effort: Pulmonary effort is normal. No respiratory distress.  Skin:    Coloration: Skin is not pale.  Neurological:     Mental Status: He is alert and oriented to person, place, and time.     GCS: GCS eye subscore is 4. GCS verbal subscore is 5. GCS motor subscore is 6.     Cranial Nerves: No dysarthria or facial asymmetry.  Psychiatric:        Mood and Affect: Mood normal.        Behavior: Behavior normal.        Assessment & Plan    1. Tick bite, initial encounter  Patient with history of viral meningitis and migraines presenting today with headache after tick bite. RMSF and Lyme testing on 09/11/2019 was negative  and patient receive prophylactic doxycycline at the time. Patient appears well on exam. His CBC on 09/11/2019 was unremarkable as was his CMET. DDx: migraine, viral illness including COVID. Advised to get COVID testing. Will repeat labs. Do not suspect meningitis at this point but return precautions counseled.   - Comprehensive Metabolic Panel (CMET) - CBC with Differential  2. Acute nonintractable headache, unspecified headache type  - Comprehensive Metabolic Panel (CMET) - CBC with Differential    No follow-ups on file.     I discussed the assessment and treatment plan with the patient. The patient was provided an opportunity to ask questions and all were answered. The patient agreed with the plan and demonstrated an  understanding of the instructions.   The patient was advised to call back or seek an in-person evaluation if the symptoms worsen or if the condition fails to improve as anticipated.  I provided 15 minutes of non-face-to-face time during this encounter.  ITrey Sailors, PA-C, have reviewed all documentation for this visit. The documentation on 09/16/19 for the exam, diagnosis, procedures, and orders are all accurate and complete.   Maryella Shivers [facility] [phone] (phone) [phone] (fax)  [facility]

## 2019-09-16 NOTE — Telephone Encounter (Signed)
Result Communications   Result Notes and Comments to Patient Comment seen by patient Brett Stephenson on 09/16/2019 10:51 AM EDT

## 2020-01-27 ENCOUNTER — Other Ambulatory Visit: Payer: BC Managed Care – PPO

## 2020-01-27 ENCOUNTER — Other Ambulatory Visit: Payer: Self-pay | Admitting: Radiology

## 2020-01-27 DIAGNOSIS — Z20822 Contact with and (suspected) exposure to covid-19: Secondary | ICD-10-CM

## 2020-01-28 LAB — SARS-COV-2, NAA 2 DAY TAT

## 2020-01-28 LAB — NOVEL CORONAVIRUS, NAA: SARS-CoV-2, NAA: NOT DETECTED

## 2020-02-03 ENCOUNTER — Other Ambulatory Visit: Payer: Self-pay

## 2020-02-03 ENCOUNTER — Encounter: Payer: Self-pay | Admitting: Physician Assistant

## 2020-02-03 ENCOUNTER — Ambulatory Visit: Payer: BC Managed Care – PPO | Admitting: Physician Assistant

## 2020-02-03 DIAGNOSIS — R1032 Left lower quadrant pain: Secondary | ICD-10-CM | POA: Diagnosis not present

## 2020-02-03 MED ORDER — AMOXICILLIN-POT CLAVULANATE 875-125 MG PO TABS: 1.0000 | ORAL_TABLET | Freq: Two times a day (BID) | ORAL | 0 refills | Status: DC

## 2020-02-03 NOTE — Patient Instructions (Signed)
Diverticulitis  Diverticulitis is infection or inflammation of small pouches (diverticula) in the colon that form due to a condition called diverticulosis. Diverticula can trap stool (feces) and bacteria, causing infection and inflammation. Diverticulitis may cause severe stomach pain and diarrhea. It may lead to tissue damage in the colon that causes bleeding. The diverticula may also burst (rupture) and cause infected stool to enter other areas of the abdomen. Complications of diverticulitis can include:  Bleeding.  Severe infection.  Severe pain.  Rupture (perforation) of the colon.  Blockage (obstruction) of the colon. What are the causes? This condition is caused by stool becoming trapped in the diverticula, which allows bacteria to grow in the diverticula. This leads to inflammation and infection. What increases the risk? You are more likely to develop this condition if:  You have diverticulosis. The risk for diverticulosis increases if: ? You are overweight or obese. ? You use tobacco products. ? You do not get enough exercise.  You eat a diet that does not include enough fiber. High-fiber foods include fruits, vegetables, beans, nuts, and whole grains. What are the signs or symptoms? Symptoms of this condition may include:  Pain and tenderness in the abdomen. The pain is normally located on the left side of the abdomen, but it may occur in other areas.  Fever and chills.  Bloating.  Cramping.  Nausea.  Vomiting.  Changes in bowel routines.  Blood in your stool. How is this diagnosed? This condition is diagnosed based on:  Your medical history.  A physical exam.  Tests to make sure there is nothing else causing your condition. These tests may include: ? Blood tests. ? Urine tests. ? Imaging tests of the abdomen, including X-rays, ultrasounds, MRIs, or CT scans. How is this treated? Most cases of this condition are mild and can be treated at home.  Treatment may include:  Taking over-the-counter pain medicines.  Following a clear liquid diet.  Taking antibiotic medicines by mouth.  Rest. More severe cases may need to be treated at a hospital. Treatment may include:  Not eating or drinking.  Taking prescription pain medicine.  Receiving antibiotic medicines through an IV tube.  Receiving fluids and nutrition through an IV tube.  Surgery. When your condition is under control, your health care provider may recommend that you have a colonoscopy. This is an exam to look at the entire large intestine. During the exam, a lubricated, bendable tube is inserted into the anus and then passed into the rectum, colon, and other parts of the large intestine. A colonoscopy can show how severe your diverticula are and whether something else may be causing your symptoms. Follow these instructions at home: Medicines  Take over-the-counter and prescription medicines only as told by your health care provider. These include fiber supplements, probiotics, and stool softeners.  If you were prescribed an antibiotic medicine, take it as told by your health care provider. Do not stop taking the antibiotic even if you start to feel better.  Do not drive or use heavy machinery while taking prescription pain medicine. General instructions   Follow a full liquid diet or another diet as directed by your health care provider. After your symptoms improve, your health care provider may tell you to change your diet. He or she may recommend that you eat a diet that contains at least 25 g (25 grams) of fiber daily. Fiber makes it easier to pass stool. Healthy sources of fiber include: ? Berries. One cup contains 4-8 grams of   fiber. ? Beans or lentils. One half cup contains 5-8 grams of fiber. ? Green vegetables. One cup contains 4 grams of fiber.  Exercise for at least 30 minutes, 3 times each week. You should exercise hard enough to raise your heart rate and  break a sweat.  Keep all follow-up visits as told by your health care provider. This is important. You may need a colonoscopy. Contact a health care provider if:  Your pain does not improve.  You have a hard time drinking or eating food.  Your bowel movements do not return to normal. Get help right away if:  Your pain gets worse.  Your symptoms do not get better with treatment.  Your symptoms suddenly get worse.  You have a fever.  You vomit more than one time.  You have stools that are bloody, black, or tarry. Summary  Diverticulitis is infection or inflammation of small pouches (diverticula) in the colon that form due to a condition called diverticulosis. Diverticula can trap stool (feces) and bacteria, causing infection and inflammation.  You are at higher risk for this condition if you have diverticulosis and you eat a diet that does not include enough fiber.  Most cases of this condition are mild and can be treated at home. More severe cases may need to be treated at a hospital.  When your condition is under control, your health care provider may recommend that you have an exam called a colonoscopy. This exam can show how severe your diverticula are and whether something else may be causing your symptoms. This information is not intended to replace advice given to you by your health care provider. Make sure you discuss any questions you have with your health care provider. Document Revised: 04/12/2017 Document Reviewed: 06/02/2016 Elsevier Patient Education  2020 Elsevier Inc.  

## 2020-02-03 NOTE — Progress Notes (Signed)
Established patient visit   Patient: Brett Stephenson   DOB: 01-07-1986   34 y.o. Male  MRN: [id number] Visit Date: 02/03/2020  Today's healthcare provider: Margaretann Loveless, PA-C   Chief Complaint  Patient presents with  . Abdominal Pain   Subjective    Abdominal Pain This is a new problem. The current episode started 1 to 4 weeks ago (Wednesday a week ago today). The onset quality is gradual. The problem occurs constantly. The problem has been gradually worsening. The pain is located in the LUQ and LLQ. The pain is at a severity of 6/10. The pain is moderate. The quality of the pain is a sensation of fullness, aching, burning and sharp ("pinching"). The abdominal pain does not radiate. Associated symptoms include nausea. Pertinent negatives include no constipation, diarrhea, dysuria, fever, frequency, vomiting or weight loss. Associated symptoms comments: Nausea after meals He is having a lot of burping. The pain is aggravated by movement. The pain is relieved by belching (lying down). He has tried antacids for the symptoms. The treatment provided no relief.    Patient Active Problem List   Diagnosis Date Noted  . Viral meningitis 06/08/2016  . Migraines 11/01/2014   Past Medical History:  Diagnosis Date  . Migraine      Patient Active Problem List   Diagnosis Date Noted  . Viral meningitis 06/08/2016  . Migraines 11/01/2014   Past Medical History:  Diagnosis Date  . Migraine        Medications: Outpatient Medications Prior to Visit  Medication Sig  . propranolol (INDERAL) 20 MG tablet TAKE 1 TABLET (20 MG TOTAL) BY MOUTH 3 (THREE) TIMES DAILY AS NEEDED.  . rizatriptan (MAXALT) 10 MG tablet Take 1 tablet (10 mg total) by mouth as needed for migraine. May repeat in 2 hours if needed   No facility-administered medications prior to visit.    Review of Systems  Constitutional: Negative for fever and weight loss.  Respiratory: Negative.   Cardiovascular:  Negative.   Gastrointestinal: Positive for abdominal distention, abdominal pain and nausea. Negative for anal bleeding, blood in stool, constipation, diarrhea, rectal pain and vomiting.  Genitourinary: Negative for difficulty urinating, dysuria, enuresis, flank pain and frequency.  Neurological: Negative.     Last CBC Lab Results  Component Value Date   WBC 5.0 09/16/2019   HGB 14.3 09/16/2019   HCT 42.1 09/16/2019   MCV 86.6 09/16/2019   MCH 29.4 09/16/2019   RDW 12.5 09/16/2019   PLT 175 09/16/2019   Last metabolic panel Lab Results  Component Value Date   GLUCOSE 100 (H) 09/16/2019   NA 138 09/16/2019   K 4.3 09/16/2019   CL 103 09/16/2019   CO2 27 09/16/2019   BUN 14 09/16/2019   CREATININE 0.94 09/16/2019   GFRNONAA >60 09/16/2019   GFRAA >60 09/16/2019   CALCIUM 8.9 09/16/2019   PROT 7.5 09/16/2019   ALBUMIN 4.9 09/16/2019   LABGLOB 2.3 09/11/2019   AGRATIO 2.2 09/11/2019   BILITOT 0.8 09/16/2019   ALKPHOS 40 09/16/2019   AST 24 09/16/2019   ALT 35 09/16/2019   ANIONGAP 8 09/16/2019      Objective    BP 101/73 (BP Location: Left Arm, Patient Position: Sitting, Cuff Size: Large)   Pulse 70   Temp 98.8 F (37.1 C) (Oral)   Resp 16   Wt 175 lb (79.4 kg)   SpO2 100%   BMI 25.11 kg/m  BP Readings from Last 3 Encounters:  02/03/20  101/73  09/11/19 104/60  04/02/19 100/60   Wt Readings from Last 3 Encounters:  02/03/20 175 lb (79.4 kg)  09/11/19 177 lb (80.3 kg)  04/02/19 191 lb 8 oz (86.9 kg)      Physical Exam Vitals reviewed.  Constitutional:      General: He is not in acute distress.    Appearance: Normal appearance. He is well-developed and normal weight. He is not ill-appearing or diaphoretic.  HENT:     Head: Normocephalic and atraumatic.  Cardiovascular:     Rate and Rhythm: Normal rate and regular rhythm.     Heart sounds: Normal heart sounds. No murmur heard.  No friction rub. No gallop.   Pulmonary:     Effort: Pulmonary effort is  normal. No respiratory distress.     Breath sounds: Normal breath sounds. No wheezing or rales.  Abdominal:     General: Abdomen is flat. Bowel sounds are normal. There is no distension.     Palpations: Abdomen is soft. There is no mass.     Tenderness: There is abdominal tenderness in the left lower quadrant. There is guarding. There is no right CVA tenderness, left CVA tenderness or rebound. Negative signs include Murphy's sign, McBurney's sign, psoas sign and obturator sign.     Hernia: No hernia is present.  Skin:    General: Skin is warm and dry.  Neurological:     General: No focal deficit present.     Mental Status: He is alert and oriented to person, place, and time.      No results found for any visits on 02/03/20.  Assessment & Plan     1. Left lower quadrant abdominal pain DDx: Diverticulitis, constipation, kidney stone, MSK. Will order Stat CT as below for further evaluation and to r/o diverticulitis. Patient is adopted and does not know family history. Augmentin given for suspected diverticulitis. Bland diet and increase as tolerated once pain subsides. Call if worsening.  - CT Abdomen Pelvis W Contrast; Future - amoxicillin-clavulanate (AUGMENTIN) 875-125 MG tablet; Take 1 tablet by mouth 2 (two) times daily.  Dispense: 20 tablet; Refill: 0   No follow-ups on file.      Delmer Islam, PA-C, have reviewed all documentation for this visit. The documentation on 02/03/20 for the exam, diagnosis, procedures, and orders are all accurate and complete.   Reine Just  [facility] [phone] (phone) [phone] (fax)  [facility]

## 2020-02-04 ENCOUNTER — Telehealth: Payer: Self-pay

## 2020-02-04 ENCOUNTER — Ambulatory Visit
Admission: RE | Admit: 2020-02-04 | Discharge: 2020-02-04 | Disposition: A | Discharge status: Home / Self Care | Payer: BC Managed Care – PPO | Source: Ambulatory Visit | Attending: Physician Assistant | Admitting: Physician Assistant

## 2020-02-04 DIAGNOSIS — R1032 Left lower quadrant pain: Secondary | ICD-10-CM | POA: Insufficient documentation

## 2020-02-04 IMAGING — CT CT ABD-PELV W/ CM
2 of 4 series · 16 of 46 positions shown, 18 images · IV contrast (omnipaque)
Comparison: None.

CLINICAL DATA: Left lower quadrant pain, tenderness, and guarding
for approximately 2 weeks. Suspected diverticulitis.

EXAM:
CT ABDOMEN AND PELVIS WITH CONTRAST
TECHNIQUE: Multidetector CT imaging of the abdomen and pelvis was performed
using the standard protocol following bolus administration of
intravenous contrast.
CONTRAST:  100mL OMNIPAQUE IOHEXOL 300 MG/ML  SOLN

[Series 3: abd pelvis 5.00 · axial · 0.73mm/px · z∈[+1172,+1592]mm · 13 of 92 slices shown, 15 images]
[im 4/92  soft-tissue]
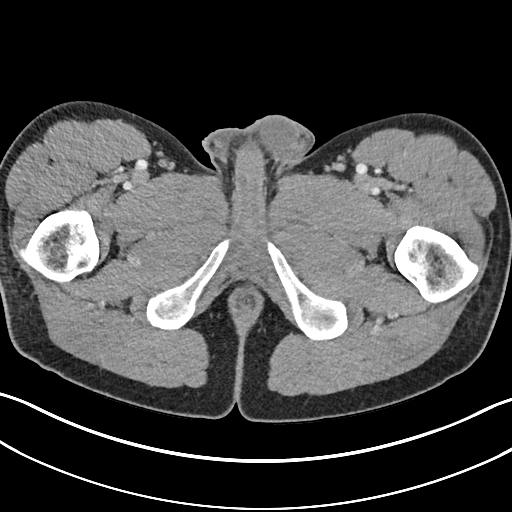
[im 4/92  bone]
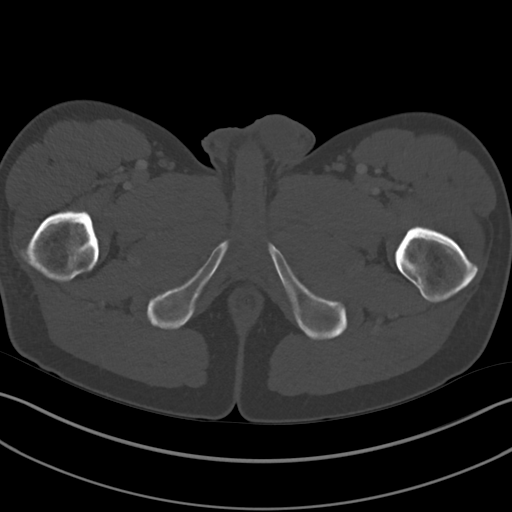
[im 12/92  soft-tissue]
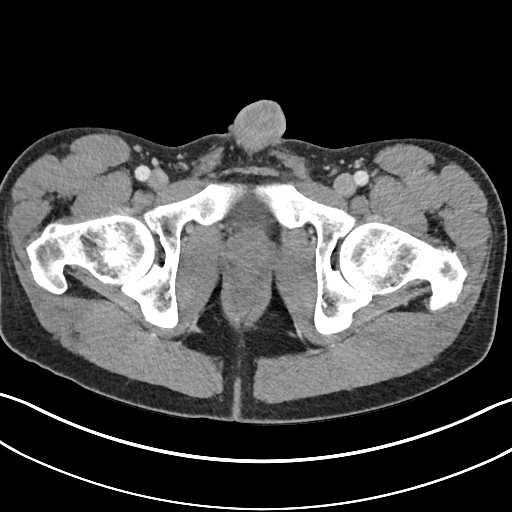
[im 19/92  soft-tissue]
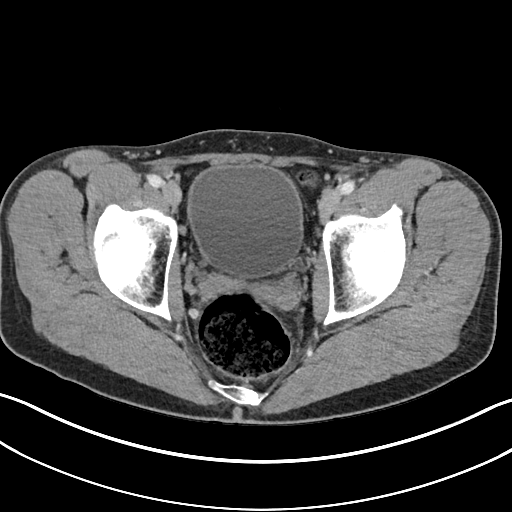
[im 27/92  soft-tissue]
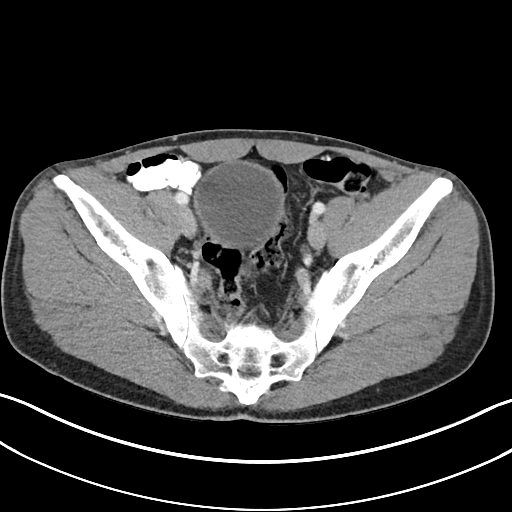
[im 31/92  soft-tissue]
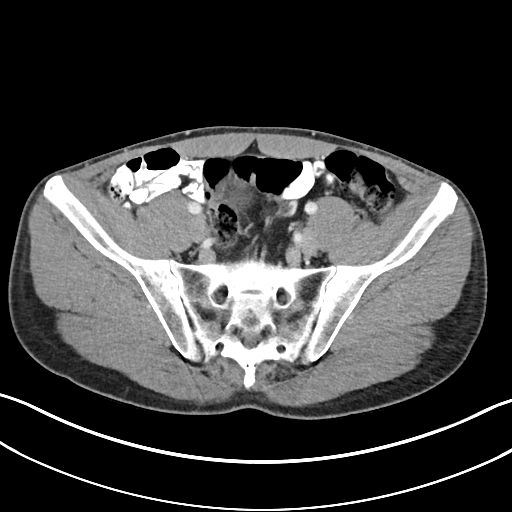
[im 38/92  soft-tissue]
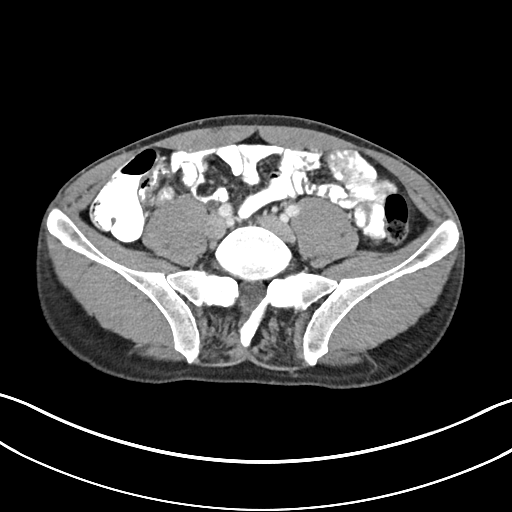
[im 46/92  soft-tissue]
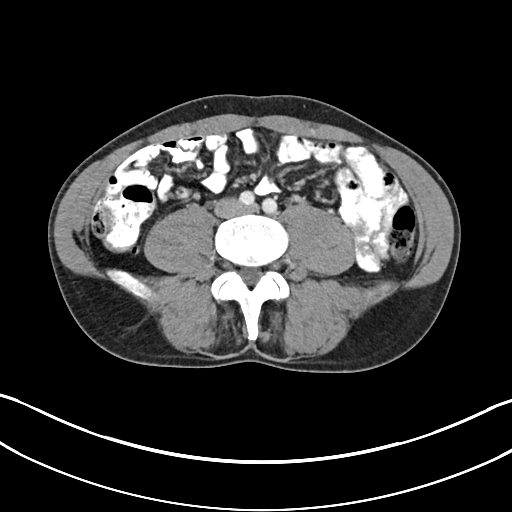
[im 54/92  soft-tissue]
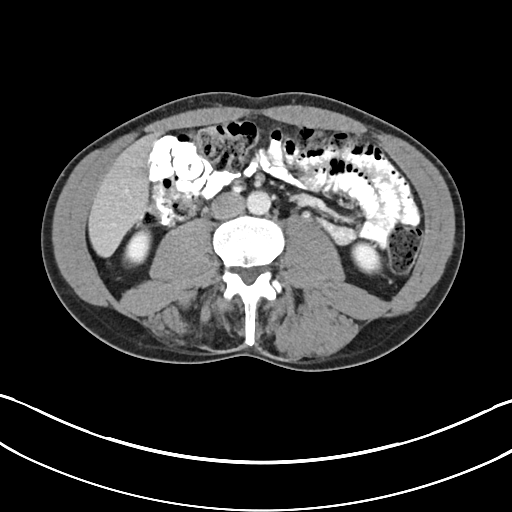
[im 61/92  soft-tissue]
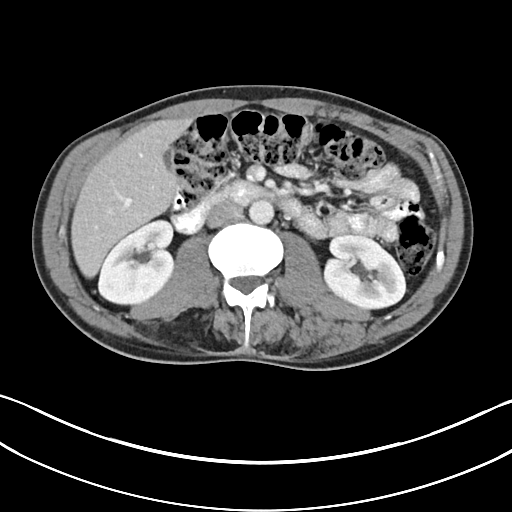
[im 61/92  bone]
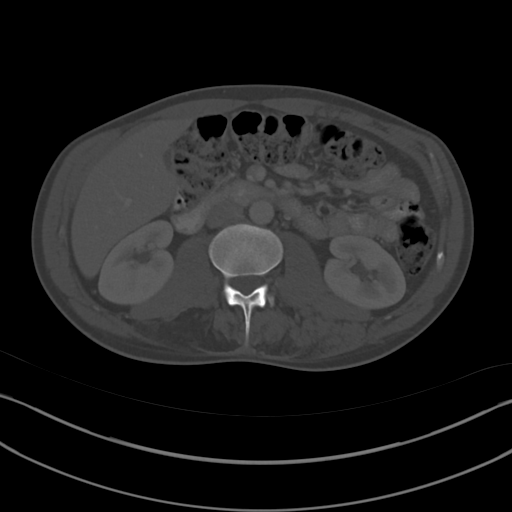
[im 65/92  soft-tissue]
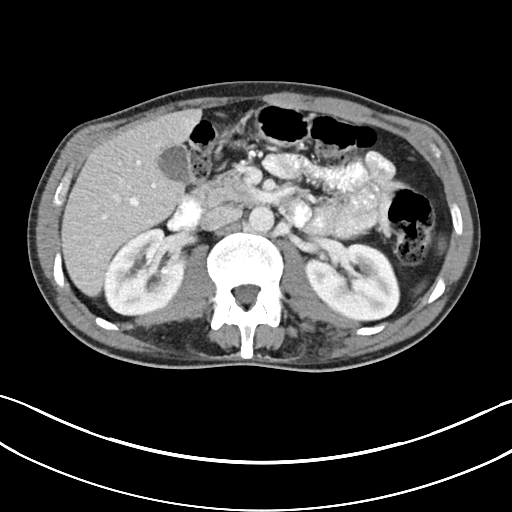
[im 73/92  soft-tissue]
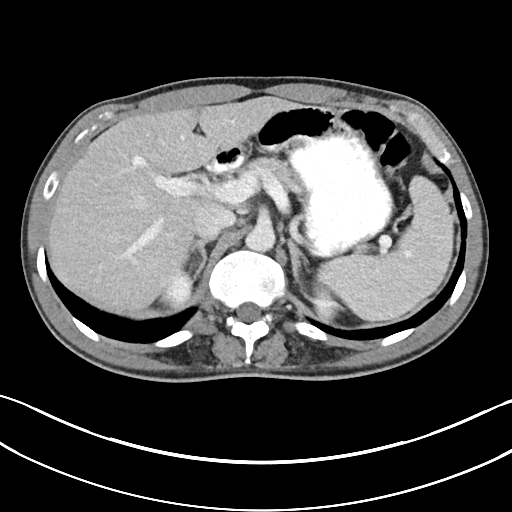
[im 80/92  soft-tissue]
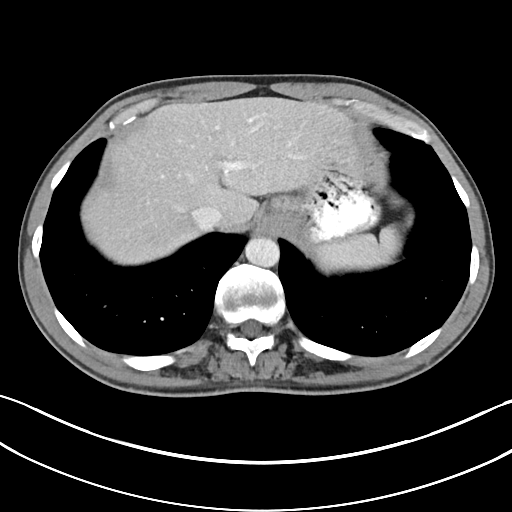
[im 88/92  soft-tissue]
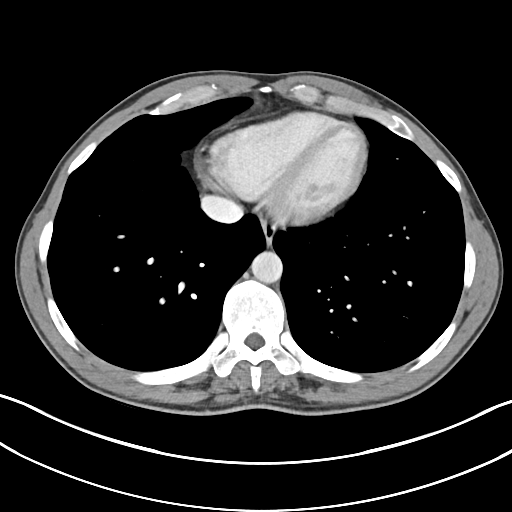

[Series 5: coronals abd pelvis 2.00 cor · coronal · 0.73mm/px · 3 of 130 slices shown]
[im 44/130  soft-tissue]
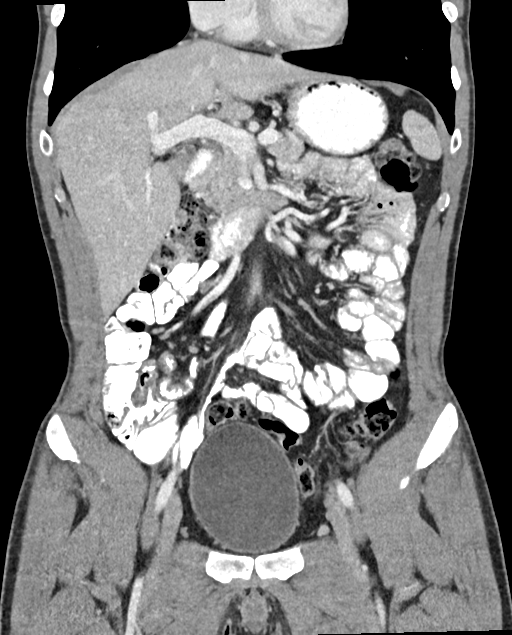
[im 58/130  soft-tissue]
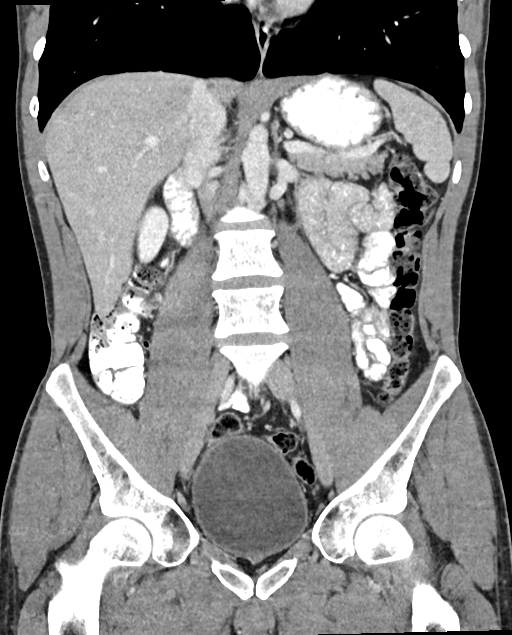
[im 72/130  soft-tissue]
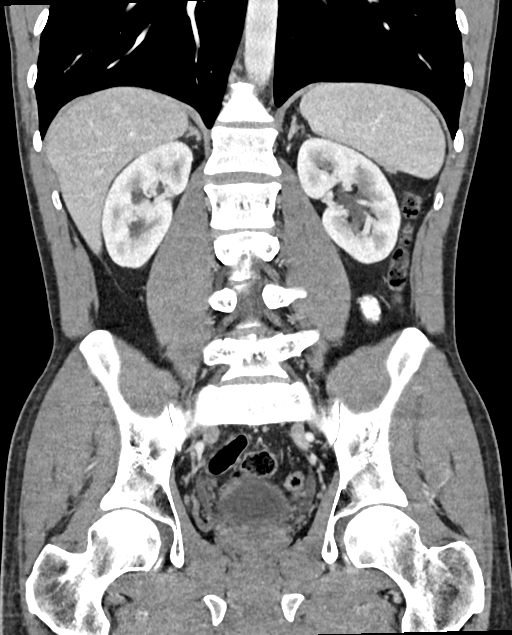

[16 of 46 positions shown; findings below may reference images not displayed]

FINDINGS: Lower Chest: No acute findings.

Hepatobiliary: No hepatic masses identified. Gallbladder is
unremarkable. No evidence of biliary ductal dilatation.

Pancreas:  No mass or inflammatory changes.

Spleen: Within normal limits in size and appearance.

Adrenals/Urinary Tract: No masses identified. No evidence of
ureteral calculi or hydronephrosis.

Stomach/Bowel: No evidence of obstruction, inflammatory process or
abnormal fluid collections. Normal appendix visualized.

Vascular/Lymphatic: No pathologically enlarged lymph nodes. No
abdominal aortic aneurysm.

Reproductive:  No mass or other significant abnormality.

Other:  None.

Musculoskeletal:  No suspicious bone lesions identified.
IMPRESSION: Negative. No radiograph evidence of diverticulitis or other acute
findings.

## 2020-02-04 MED ORDER — IOHEXOL 300 MG/ML  SOLN
100.0000 mL | Freq: Once | INTRAMUSCULAR | Status: AC | PRN
  Administered 2020-02-04: 100 mL via INTRAVENOUS

## 2020-02-04 NOTE — Telephone Encounter (Signed)
-----   Message from Margaretann Loveless, [place] sent at 02/04/2020  1:19 PM EDT ----- CT abd/pelvis is completely normal. There is no diverticulitis, no kidney stones. Appendix and gallbladder are both normal. Pancreas is normal. Kidneys are normal. It is possible the pain is either from the constipation you had previously, or could be an acute musculoskeletal strain. Would recommend to continue pushing fluids, may use Miralax if needed for bowel movements. May use heat on the area. Can use tylenol or ibuprofen for possible muscle strain.

## 2020-02-04 NOTE — Telephone Encounter (Signed)
Written by Margaretann Loveless, PA-C on 02/04/2020 1:19 PM EDT View Full Comments Seen by patient Lorina Rabon on 02/04/2020 1:20 PM

## 2020-08-16 ENCOUNTER — Telehealth: Payer: Self-pay

## 2020-08-16 DIAGNOSIS — R6889 Other general symptoms and signs: Secondary | ICD-10-CM

## 2020-08-16 DIAGNOSIS — W57XXXA Bitten or stung by nonvenomous insect and other nonvenomous arthropods, initial encounter: Secondary | ICD-10-CM

## 2020-08-16 MED ORDER — DOXYCYCLINE HYCLATE 100 MG PO TABS: 100.0000 mg | ORAL_TABLET | Freq: Two times a day (BID) | ORAL | 0 refills | Status: DC

## 2020-08-16 NOTE — Telephone Encounter (Signed)
He can come weds or thurs. I will send in Doxycycline for possible tick borne illness

## 2020-08-16 NOTE — Addendum Note (Signed)
Addended by: Margaretann Loveless on: 08/16/2020 04:35 PM   Modules accepted: Orders

## 2020-08-16 NOTE — Telephone Encounter (Signed)
Copied from CRM [id number]. Topic: Appointment Scheduling - Scheduling Inquiry for Clinic >> Aug 16, 2020  2:07 PM Gaetana Michaelis A wrote: Reason for CRM: Patient has been recently bitten by a tick (approx two weeks ago) around 08/06/20  Patient has began to experience flu like symptoms (including vomiting and nausea)  Patient would like to be seen in office for an appointment and testing  Per DT, agent was unable to schedule a visit at the time of call  Please contact to further advise scheduling

## 2020-08-17 ENCOUNTER — Other Ambulatory Visit: Payer: Self-pay

## 2020-08-17 DIAGNOSIS — R609 Edema, unspecified: Secondary | ICD-10-CM | POA: Diagnosis not present

## 2020-08-17 DIAGNOSIS — T7840XA Allergy, unspecified, initial encounter: Secondary | ICD-10-CM | POA: Insufficient documentation

## 2020-08-17 DIAGNOSIS — L299 Pruritus, unspecified: Secondary | ICD-10-CM | POA: Diagnosis not present

## 2020-08-17 DIAGNOSIS — X58XXXA Exposure to other specified factors, initial encounter: Secondary | ICD-10-CM | POA: Insufficient documentation

## 2020-08-17 NOTE — ED Triage Notes (Signed)
Pt states he noticed earlier tonight that his bottom lip became very itchy and then swollen, pt took benadryl pta with no relief. Pt is now noticing hives to body. Pt denies any difficulty breathing/swallowing.

## 2020-08-17 NOTE — Telephone Encounter (Signed)
Patient was advised and reports that he would like a different antibiotic due to doxycycline given bad migraines. He scheduled appointment with you tomorrow, 08/18/2020 @ 1:40 PM and states y'all can wait to send in some different at his appointment. Just a [place]

## 2020-08-18 ENCOUNTER — Encounter: Payer: Self-pay | Admitting: Physician Assistant

## 2020-08-18 ENCOUNTER — Emergency Department
Admission: EM | Admit: 2020-08-18 | Discharge: 2020-08-18 | Disposition: A | Discharge status: Home / Self Care | Payer: BC Managed Care – PPO | Attending: Emergency Medicine | Admitting: Emergency Medicine

## 2020-08-18 ENCOUNTER — Ambulatory Visit: Payer: BC Managed Care – PPO | Admitting: Physician Assistant

## 2020-08-18 VITALS — BP 120/66 | HR 80 | Temp 98.7°F | Resp 16 | Ht 70.0 in | Wt 172.9 lb

## 2020-08-18 DIAGNOSIS — R112 Nausea with vomiting, unspecified: Secondary | ICD-10-CM | POA: Diagnosis not present

## 2020-08-18 DIAGNOSIS — R6883 Chills (without fever): Secondary | ICD-10-CM | POA: Diagnosis not present

## 2020-08-18 DIAGNOSIS — S30860A Insect bite (nonvenomous) of lower back and pelvis, initial encounter: Secondary | ICD-10-CM

## 2020-08-18 DIAGNOSIS — R5081 Fever presenting with conditions classified elsewhere: Secondary | ICD-10-CM | POA: Diagnosis not present

## 2020-08-18 DIAGNOSIS — W57XXXA Bitten or stung by nonvenomous insect and other nonvenomous arthropods, initial encounter: Secondary | ICD-10-CM

## 2020-08-18 DIAGNOSIS — T7840XA Allergy, unspecified, initial encounter: Secondary | ICD-10-CM

## 2020-08-18 MED ORDER — EPINEPHRINE 0.3 MG/0.3ML IJ SOAJ: 0.3000 mg | INTRAMUSCULAR | 1 refills | Status: AC | PRN

## 2020-08-18 MED ORDER — PREDNISONE 20 MG PO TABS
60.0000 mg | ORAL_TABLET | Freq: Once | ORAL | Status: AC
  Administered 2020-08-18: 60 mg via ORAL
  Filled 2020-08-18: qty 3

## 2020-08-18 MED ORDER — PREDNISONE 10 MG (21) PO TBPK: ORAL_TABLET | ORAL | 0 refills | Status: AC

## 2020-08-18 MED ORDER — FAMOTIDINE 20 MG PO TABS: 20.0000 mg | ORAL_TABLET | Freq: Two times a day (BID) | ORAL | 0 refills | Status: AC

## 2020-08-18 NOTE — Patient Instructions (Signed)
Tick Bite Information, Adult  Ticks are insects that can bite. Most ticks live in shrubs and grassy areas. They climb onto people and animals that go by. Then they bite. Some ticks carry germs that can make you sick. How can I prevent tick bites? Take these steps: Use insect repellent  Use an insect repellent that has 20% or higher of the ingredients DEET, picaridin, or IR3535. Follow the instructions on the label. Put it on: ? Bare skin. ? The tops of your boots. ? Your pant legs. ? The ends of your sleeves.  If you use an insect repellent that has the ingredient permethrin, follow the instructions on the label. Put it on: ? Clothing. ? Boots. ? Supplies or outdoor gear. ? Tents. When you are outside  Wear long sleeves and long pants.  Wear light-colored clothes.  Tuck your pant legs into your socks.  Stay in the middle of the trail. Do not touch the bushes.  Avoid walking through long grass.  Check for ticks on your clothes, hair, and skin often while you are outside. Before going inside your house, check your clothes, skin, head, neck, armpits, waist, groin, and joint areas. When you go indoors  Check your clothes for ticks. Dry your clothes in a dryer on high heat for 10 minutes or more. If clothes are damp, additional time may be needed.  Wash your clothes right away if they need to be washed. Use hot water.  Check your pets and outdoor gear.  Shower right away.  Check your body for ticks. Do a full body check using a mirror. What is the right way to remove a tick? Remove the tick from your skin as soon as possible. Do not remove the tick with your bare fingers.  To remove a tick that is crawling on your skin: ? Go outdoors and brush the tick off. ? Use tape or a lint roller.  To remove a tick that is biting: 1. Wash your hands. 2. If you have latex gloves, put them on. 3. Use tweezers, curved forceps, or a tick-removal tool to grasp the tick. Grasp the tick  as close to your skin and as close to the tick's head as possible. 4. Gently pull up until the tick lets go.  Try to keep the tick's head attached to its body.  Do not twist or jerk the tick.  Do not squeeze or crush the tick. Do not try to remove a tick with heat, alcohol, petroleum jelly, or fingernail polish.   What should I do after taking out a tick?  Throw away the tick. Do not crush a tick with your fingers.  Clean the bite area and your hands with soap and water, rubbing alcohol, or an iodine wash.  If an antiseptic cream or ointment is available, apply a small amount to the bite area.  Wash and disinfect any instruments that you used to remove the tick. How should I get rid of a live tick? To dispose of a live tick, use one of these methods:  Place the tick in rubbing alcohol.  Place the tick in a bag or container you can close tightly.  Wrap the tick tightly in tape.  Flush the tick down the toilet. Contact a doctor if:  You have symptoms, such as: ? A fever or chills. ? A red rash that makes a circle (bull's-eye rash) in the bite area. ? Redness and swelling where the tick bit you. ? Headache. ?   Pain in a muscle, joint, or bone. ? Being more tired than normal. ? Trouble walking or moving your legs. ? Numbness in your legs. ? Tender and swollen lymph glands.  A part of a tick breaks off and gets stuck in your skin. Get help right away if:  You cannot remove a tick.  You cannot move (have paralysis) or feel weak.  You are feeling worse or have new symptoms.  You find a tick that is biting you and filled with blood. This is important if you are in an area where diseases from ticks are common. Summary  Ticks may carry germs that can make you sick.  To prevent tick bites wear long sleeves, long pants, and light colors. Use insect repellent. Follow the instructions on the label.  If the tick is biting, do not try to remove it with heat, alcohol, petroleum  jelly, or fingernail polish.  Use tweezers, curved forceps, or a tick-removal tool to grasp the tick. Gently pull up until the tick lets go. Do not twist or jerk the tick. Do not squeeze or crush the tick.  If you have symptoms, contact a doctor. This information is not intended to replace advice given to you by your health care provider. Make sure you discuss any questions you have with your health care provider. Document Revised: 04/27/2019 Document Reviewed: 04/27/2019 Elsevier Patient Education  2021 [employer]  

## 2020-08-18 NOTE — ED Notes (Signed)
Pt c/o of unknown allergic reaction happening around 2040 last night. Pt states he had lip swelling that has since improved. Pt denies pain or any other complaints at this time. Pt able to speak in full sentences and appears in NAD.

## 2020-08-18 NOTE — Discharge Instructions (Signed)
You may continue Benadryl 50 mg every 6 hours as needed for rash, itching, swelling.  If you develop return of lip swelling or have tongue swelling, difficulty swallowing, speaking or breathing, please give yourself epinephrine, call 911 and return to the emergency department.

## 2020-08-18 NOTE — ED Provider Notes (Signed)
[facility] Emergency Department Provider Note  ____________________________________________   Event Date/Time   First MD Initiated Contact with Patient 08/18/20 [phone]     (approximate)  I have reviewed the triage vital signs and the nursing notes.   HISTORY  Chief Complaint No chief complaint on file.    HPI Brett Stephenson is a 35 y.o. male with history of migraine headaches who presents to the emergency department with complaints of an allergic reaction.  States just before dinner he was outside playing with his dog when he felt like his lower lip was itching.  He then noticed that it felt like it was swelling and then started having swelling of the upper lip.  He started having itching all over without rash.  Took 50 mg of Benadryl and waited approximately 45 minutes.  States symptoms have improved.  He is no longer itching and feels his lip swelling has resolved.  He denies any difficulty swallowing, speaking or breathing.  No lightheadedness.  No new exposures including new soaps, lotions, detergents, medications, foods.  States that this is not a new pet for him.  He does report that he noticed a small bump to the left hip which he was concerned could have been a bug bite but he does not recall any bugs biting or stinging him.  He has no known history of allergies.  He has never had this happen to him before.  No history of hypertension and not on any medications.  No family history of angioedema, allergic reactions.        Past Medical History:  Diagnosis Date  . Migraine     Patient Active Problem List   Diagnosis Date Noted  . Viral meningitis 06/08/2016  . Migraines 11/01/2014    History reviewed. No pertinent surgical history.  Prior to Admission medications   Medication Sig Start Date End Date Taking? Authorizing Provider  EPINEPHrine 0.3 mg/0.3 mL IJ SOAJ injection Inject 0.3 mg into the muscle as needed for anaphylaxis. 08/18/20  Yes Maude Hettich,  Layla Maw, DO  famotidine (PEPCID) 20 MG tablet Take 1 tablet (20 mg total) by mouth 2 (two) times daily for 14 days. 08/18/20 09/01/20 Yes Maikayla Beggs, Layla Maw, DO  predniSONE (STERAPRED UNI-PAK 21 TAB) 10 MG (21) TBPK tablet Take as directed.  Start on 08/19/20. 08/18/20  Yes Diangelo Radel, Layla Maw, DO  amoxicillin-clavulanate (AUGMENTIN) 875-125 MG tablet Take 1 tablet by mouth 2 (two) times daily. 02/03/20   Margaretann Loveless, PA-C  doxycycline (VIBRA-TABS) 100 MG tablet Take 1 tablet (100 mg total) by mouth 2 (two) times daily. 08/16/20   Margaretann Loveless, PA-C  propranolol (INDERAL) 20 MG tablet TAKE 1 TABLET (20 MG TOTAL) BY MOUTH 3 (THREE) TIMES DAILY AS NEEDED. 03/12/19   Margaretann Loveless, PA-C  rizatriptan (MAXALT) 10 MG tablet Take 1 tablet (10 mg total) by mouth as needed for migraine. May repeat in 2 hours if needed 12/26/18   Margaretann Loveless, PA-C    Allergies Patient has no known allergies.  Family History  Adopted: Yes    Social History Social History   Tobacco Use  . Smoking status: Never Smoker  . Smokeless tobacco: Never Used  Substance Use Topics  . Alcohol use: Yes    Alcohol/week: 5.0 standard drinks    Types: 5 Glasses of wine per week  . Drug use: No    Review of Systems Constitutional: No fever. Eyes: No visual changes. ENT: No sore throat. Cardiovascular:  Denies chest pain. Respiratory: Denies shortness of breath. Gastrointestinal: No nausea, vomiting, diarrhea. Genitourinary: Negative for dysuria. Musculoskeletal: Negative for back pain. Skin: Negative for rash. Neurological: Negative for focal weakness or numbness.  ____________________________________________   PHYSICAL EXAM:  VITAL SIGNS: ED Triage Vitals  Enc Vitals Group     BP 08/17/20 2157 116/73     Pulse Rate 08/17/20 2157 81     Resp 08/17/20 2157 18     Temp 08/17/20 2155 98 F (36.7 C)     Temp Source 08/17/20 2155 Oral     SpO2 08/17/20 2157 100 %     Weight 08/17/20 2155 175 lb  (79.4 kg)     Height 08/17/20 2155 5\' 10"  (1.778 m)     Head Circumference --      Peak Flow --      Pain Score 08/17/20 2154 0     Pain Loc --      Pain Edu? --      Excl. in GC? --    CONSTITUTIONAL: Alert and oriented and responds appropriately to questions. Well-appearing; well-nourished HEAD: Normocephalic EYES: Conjunctivae clear, pupils appear equal, EOM appear intact ENT: normal nose; moist mucous membranes, no angioedema, normal phonation, no trismus, no stridor or drooling, posterior oropharynx is patent without swelling, no tonsillar hypertrophy or exudate, no uvular swelling or deviation NECK: Supple, normal ROM CARD: RRR; S1 and S2 appreciated; no murmurs, no clicks, no rubs, no gallops RESP: Normal chest excursion without splinting or tachypnea; breath sounds clear and equal bilaterally; no wheezes, no rhonchi, no rales, no hypoxia or respiratory distress, speaking full sentences ABD/GI: Normal bowel sounds; non-distended; soft, non-tender, no rebound, no guarding, no peritoneal signs, no hepatosplenomegaly BACK: The back appears normal EXT: Normal ROM in all joints; no deformity noted, no edema; no cyanosis SKIN: Normal color for age and race; warm; no urticaria, patient does have a small macular papular area to the right hip without surrounding redness or warmth and no drainage or bleeding, no blisters or desquamation, no petechia or purpura NEURO: Moves all extremities equally PSYCH: The patient's mood and manner are appropriate.  ____________________________________________   LABS (all labs ordered are listed, but only abnormal results are displayed)  Labs Reviewed - No data to display ____________________________________________  EKG   ____________________________________________  RADIOLOGY I, Ardenia Stiner, personally viewed and evaluated these images (plain radiographs) as part of my medical decision making, as well as reviewing the written report by the  radiologist.  ED MD interpretation:    Official radiology report(s): No results found.  ____________________________________________   PROCEDURES  Procedure(s) performed (including Critical Care):  Procedures   ____________________________________________   INITIAL IMPRESSION / ASSESSMENT AND PLAN / ED COURSE  As part of my medical decision making, I reviewed the following data within the electronic MEDICAL RECORD NUMBER Nursing notes reviewed and incorporated, Old chart reviewed and Notes from prior ED visits         Patient here for allergic reaction.  Took Benadryl prior to arrival now symptoms have resolved.  Unclear etiology for patient's symptoms.  He is not on an ACE inhibitor or ARB.  No family history of similar symptoms.  We will start him on steroid taper and Pepcid.  Will give prescription for EpiPen to use as needed if symptoms return and are severe.  Discussed return precautions and recommended outpatient follow-up with an allergy specialist.  He will continue over-the-counter Benadryl as needed.  Patient comfortable with this plan.  At this  time, I do not feel there is any life-threatening condition present. I have reviewed, interpreted and discussed all results (EKG, imaging, lab, urine as appropriate) and exam findings with patient/family. I have reviewed nursing notes and appropriate previous records.  I feel the patient is safe to be discharged home without further emergent workup and can continue workup as an outpatient as needed. Discussed usual and customary return precautions. Patient/family verbalize understanding and are comfortable with this plan.  Outpatient follow-up has been provided as needed. All questions have been answered.  ____________________________________________   FINAL CLINICAL IMPRESSION(S) / ED DIAGNOSES  Final diagnoses:  Allergic reaction, initial encounter     ED Discharge Orders         Ordered    predniSONE (STERAPRED UNI-PAK 21  TAB) 10 MG (21) TBPK tablet        08/18/20 0235    EPINEPHrine 0.3 mg/0.3 mL IJ SOAJ injection  As needed        08/18/20 0235    famotidine (PEPCID) 20 MG tablet  2 times daily        08/18/20 0235          *Please note:  Tion Stephenson was evaluated in Emergency Department on 08/18/2020 for the symptoms described in the history of present illness. He was evaluated in the context of the global COVID-19 pandemic, which necessitated consideration that the patient might be at risk for infection with the SARS-CoV-2 virus that causes COVID-19. Institutional protocols and algorithms that pertain to the evaluation of patients at risk for COVID-19 are in a state of rapid change based on information released by regulatory bodies including the CDC and federal and state organizations. These policies and algorithms were followed during the patient's care in the ED.  Some ED evaluations and interventions may be delayed as a result of limited staffing during and the pandemic.*   Note:  This document was prepared using Dragon voice recognition software and may include unintentional dictation errors.   Renley Gutman, Layla Maw, DO 08/18/20 [phone]

## 2020-08-18 NOTE — Progress Notes (Signed)
Established patient visit   Patient: Brett Stephenson   DOB: 05-26-1985   35 y.o. Male  MRN: [id number] Visit Date: 08/18/2020  Today's healthcare provider: Margaretann Loveless, PA-C   No chief complaint on file.  Subjective    HPI  Patient here today C/O tick bite about two weeks ago. Reports was in his groin. Was a grey colored tick, not engorged. Unsure how long tick was on him. Patient has had vomiting and nausea and chills, possible fever following tick bite.   Yesterday he did have an allergic reaction as well. Resolved with benadryl. He did go to ER and was given epi-pen. He is unsure of source. Possible from his bow and arrow as he pulls the arrow to the corner of the mouth that was most affected. Possible it could have been from eating a philly cheese steak sandwich at lunch that day. Or lastly could have been from another bug bite that occurred yesterday. This was on his left hip and had a very large hive area around yesterday, no improved.  Patient Active Problem List   Diagnosis Date Noted  . Viral meningitis 06/08/2016  . Migraines 11/01/2014   Social History   Tobacco Use  . Smoking status: Never Smoker  . Smokeless tobacco: Never Used  Substance Use Topics  . Alcohol use: Yes    Alcohol/week: 5.0 standard drinks    Types: 5 Glasses of wine per week  . Drug use: No   No Known Allergies     Medications: Outpatient Medications Prior to Visit  Medication Sig  . amoxicillin-clavulanate (AUGMENTIN) 875-125 MG tablet Take 1 tablet by mouth 2 (two) times daily.  Marland Kitchen doxycycline (VIBRA-TABS) 100 MG tablet Take 1 tablet (100 mg total) by mouth 2 (two) times daily.  Marland Kitchen EPINEPHrine 0.3 mg/0.3 mL IJ SOAJ injection Inject 0.3 mg into the muscle as needed for anaphylaxis.  . famotidine (PEPCID) 20 MG tablet Take 1 tablet (20 mg total) by mouth 2 (two) times daily for 14 days.  . predniSONE (STERAPRED UNI-PAK 21 TAB) 10 MG (21) TBPK tablet Take as directed.  Start on 08/19/20.   Marland Kitchen propranolol (INDERAL) 20 MG tablet TAKE 1 TABLET (20 MG TOTAL) BY MOUTH 3 (THREE) TIMES DAILY AS NEEDED.  . rizatriptan (MAXALT) 10 MG tablet Take 1 tablet (10 mg total) by mouth as needed for migraine. May repeat in 2 hours if needed   No facility-administered medications prior to visit.    Review of Systems  Constitutional: Positive for chills.  HENT: Positive for facial swelling.   Respiratory: Negative for shortness of breath.   Gastrointestinal: Positive for diarrhea and nausea.  Musculoskeletal: Positive for myalgias.  Skin: Positive for rash.    Last CBC Lab Results  Component Value Date   WBC 5.0 09/16/2019   HGB 14.3 09/16/2019   HCT 42.1 09/16/2019   MCV 86.6 09/16/2019   MCH 29.4 09/16/2019   RDW 12.5 09/16/2019   PLT 175 09/16/2019   Last metabolic panel Lab Results  Component Value Date   GLUCOSE 100 (H) 09/16/2019   NA 138 09/16/2019   K 4.3 09/16/2019   CL 103 09/16/2019   CO2 27 09/16/2019   BUN 14 09/16/2019   CREATININE 0.94 09/16/2019   GFRNONAA >60 09/16/2019   GFRAA >60 09/16/2019   CALCIUM 8.9 09/16/2019   PROT 7.5 09/16/2019   ALBUMIN 4.9 09/16/2019   LABGLOB 2.3 09/11/2019   AGRATIO 2.2 09/11/2019   BILITOT 0.8 09/16/2019  ALKPHOS 40 09/16/2019   AST 24 09/16/2019   ALT 35 09/16/2019   ANIONGAP 8 09/16/2019        Objective    There were no vitals taken for this visit. BP Readings from Last 3 Encounters:  08/18/20 (!) 108/59  02/03/20 101/73  09/11/19 104/60   Wt Readings from Last 3 Encounters:  08/17/20 175 lb (79.4 kg)  02/03/20 175 lb (79.4 kg)  09/11/19 177 lb (80.3 kg)      Physical Exam Vitals reviewed.  Constitutional:      General: He is not in acute distress.    Appearance: Normal appearance. He is well-developed. He is not ill-appearing.  HENT:     Head: Normocephalic and atraumatic.  Eyes:     Conjunctiva/sclera: Conjunctivae normal.  Pulmonary:     Effort: Pulmonary effort is normal. No respiratory  distress.  Musculoskeletal:     Cervical back: Normal range of motion and neck supple.  Neurological:     Mental Status: He is alert.  Psychiatric:        Behavior: Behavior normal.        Thought Content: Thought content normal.        Judgment: Judgment normal.     No results found for any visits on 08/18/20.  Assessment & Plan     1. Tick bite of pelvic region, initial encounter Will check labs as below for lyme, RMSF and alpha gal allergy. Will f/u pending these results and treat accordingly.  - B. burgdorfi antibodies - Rocky mtn spotted fvr abs pnl(IgG+IgM) - Alpha-Gal Panel  2. Nausea and vomiting, intractability of vomiting not specified, unspecified vomiting type See above medical treatment plan. - B. burgdorfi antibodies - Rocky mtn spotted fvr abs pnl(IgG+IgM) - Alpha-Gal Panel  3. Chills See above medical treatment plan. - B. burgdorfi antibodies - Rocky mtn spotted fvr abs pnl(IgG+IgM) - Alpha-Gal Panel  4. Fever in other diseases See above medical treatment plan. - B. burgdorfi antibodies - Rocky mtn spotted fvr abs pnl(IgG+IgM) - Alpha-Gal Panel   No follow-ups on file.      Delmer Islam, PA-C, have reviewed all documentation for this visit. The documentation on 08/18/20 for the exam, diagnosis, procedures, and orders are all accurate and complete.   Reine Just  [facility] [phone] (phone) [phone] (fax)  [facility]

## 2020-08-21 ENCOUNTER — Other Ambulatory Visit: Payer: Self-pay | Admitting: Physician Assistant

## 2020-08-21 DIAGNOSIS — A770 Spotted fever due to Rickettsia rickettsii: Secondary | ICD-10-CM

## 2020-08-21 LAB — ROCKY MTN SPOTTED FVR ABS PNL(IGG+IGM)
RMSF IgG: NEGATIVE
RMSF IgM: 1.5 index — ABNORMAL HIGH (ref 0.00–0.89)

## 2020-08-21 LAB — ALPHA-GAL PANEL
Allergen Lamb IgE: 0.73 kU/L — AB
Beef IgE: 3.98 kU/L — AB
IgE (Immunoglobulin E), Serum: 513 IU/mL — ABNORMAL HIGH (ref 6–495)
O215-IgE Alpha-Gal: 61.5 kU/L — AB
Pork IgE: 1.65 kU/L — AB

## 2020-08-21 LAB — B. BURGDORFI ANTIBODIES: Lyme IgG/IgM Ab: <0.91 {ISR} (ref 0.00–0.90)

## 2020-08-21 MED ORDER — DOXYCYCLINE HYCLATE 100 MG PO TABS
100.0000 mg | ORAL_TABLET | Freq: Two times a day (BID) | ORAL | 0 refills | Status: AC
Start: 2020-08-21 — End: ?

## 2020-08-22 ENCOUNTER — Encounter: Payer: Self-pay | Admitting: Physician Assistant

## 2020-08-22 NOTE — Telephone Encounter (Signed)
Called [facility] concerning [facility]cyline rx. Per [facility] patient picked-up rx for doxycycline 20 tablets bid on 08/16/2020. Patient's insurance will not cover another refill until antibiotic has been completed by 08/24/2020. Patient was advised.

## 2020-08-24 ENCOUNTER — Other Ambulatory Visit: Payer: Self-pay

## 2020-08-24 ENCOUNTER — Ambulatory Visit
Admission: EM | Admit: 2020-08-24 | Discharge: 2020-08-24 | Disposition: A | Discharge status: Home / Self Care | Payer: BC Managed Care – PPO

## 2020-08-24 ENCOUNTER — Ambulatory Visit: Payer: Self-pay | Admitting: *Deleted

## 2020-08-24 DIAGNOSIS — H9203 Otalgia, bilateral: Secondary | ICD-10-CM

## 2020-08-24 DIAGNOSIS — H579 Unspecified disorder of eye and adnexa: Secondary | ICD-10-CM

## 2020-08-24 DIAGNOSIS — R519 Headache, unspecified: Secondary | ICD-10-CM

## 2020-08-24 NOTE — Telephone Encounter (Signed)
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 Patient was started on Doxycycline on 4/11 per protocol treatment. Patient states within 2 hours of taking first dose he had headache which he thought may have been migraine. Patient had continued antibiotic and developed more symptoms- eye/ear pressure, static noise in ears and neck pain. Patient thinks now he has had reaction to the antibiotic- he stopped dosing this morning. Patient wants to know next steps for treatment.  Reason for Disposition . [1] Caller has URGENT medicine question about med that PCP or specialist prescribed AND [2] triager unable to answer question  Answer Assessment - Initial Assessment Questions 1. NAME of MEDICATION: "What medicine are you calling about?"     Doxycycline started 4/11- symptoms started about 2 hours after first dose, stopped this morning dose 2. QUESTION: "What is your question?" (e.g., medication refill, side effect)     -migraine- headache 3. PRESCRIBING HCP: "Who prescribed it?" Reason: if prescribed by specialist, call should be referred to that group.     PCP 4. SYMPTOMS: "Do you have any symptoms?"     Eye and ear pressure , neck pain, static noise in ears 5. SEVERITY: If symptoms are present, ask "Are they mild, moderate or severe?"     severe  Protocols used: MEDICATION QUESTION CALL-A-AH

## 2020-08-24 NOTE — Telephone Encounter (Signed)
This does not sound like an allergic reaction.  Could be a side effect but with the [facility] spotted fever diagnosis I think I would continue the antibiotic for the week if possible

## 2020-08-24 NOTE — Telephone Encounter (Signed)
Dr. Sullivan Lone dose not sound like typical Doxycycline reaction. Patient with positive IGM RMSF seen by Antony Contras. I have not seen this patient  and would advise office visit.  Will you please review and advise.

## 2020-08-24 NOTE — Discharge Instructions (Addendum)
Go to the emergency department for evaluation of your concern for possible intracranial hypertension.

## 2020-08-24 NOTE — Telephone Encounter (Signed)
Please review. Thanks!  

## 2020-08-24 NOTE — ED Provider Notes (Signed)
Brett Stephenson    CSN: [id number] Arrival date & time: 08/24/20  1403      History   Chief Complaint Chief Complaint  Patient presents with  . Headache  . Otalgia    HPI Brett Stephenson is a 35 y.o. male.   Patient presents with headache, bilateral ear pressure and pressure behind both eyes x 2-3 days since starting doxycycline for RMSF.  His symptoms are worse when lying down.  He states he had a similar reaction to doxycycline last year; he was seen by neurology.  He is concerned for the possibility of "pseudotumor cerebri" he states.  He denies rash, dizziness, numbness, weakness, changes in vision, fever, chest pain, shortness of breath, abdominal pain, or other symptoms.  Treatment attempted at home with 1 dose of Excedrin last evening.  Patient was seen in the ED on 08/18/2020; diagnosed with an allergic reaction; discharged with EpiPen, famotidine, prednisone.  He was seen by his PCP on 08/18/2020; diagnosed with tick bite; RMSF positive and Alpha-Gal levels high; started on doxycycline on 08/21/2020.  His medical history includes migraine headaches.  The history is provided by the patient and medical records.    Past Medical History:  Diagnosis Date  . Migraine     Patient Active Problem List   Diagnosis Date Noted  . Viral meningitis 06/08/2016  . Migraines 11/01/2014    History reviewed. No pertinent surgical history.     Home Medications    Prior to Admission medications   Medication Sig Start Date End Date Taking? Authorizing Provider  topiramate (TOPAMAX) 25 MG capsule Take 25 mg by mouth 2 (two) times daily.   Yes [provider]  doxycycline (VIBRA-TABS) 100 MG tablet Take 1 tablet (100 mg total) by mouth 2 (two) times daily. 08/21/20   Margaretann Loveless, PA-C  EPINEPHrine 0.3 mg/0.3 mL IJ SOAJ injection Inject 0.3 mg into the muscle as needed for anaphylaxis. 08/18/20   Ward, Layla Maw, DO  famotidine (PEPCID) 20 MG tablet Take 1 tablet (20 mg  total) by mouth 2 (two) times daily for 14 days. 08/18/20 09/01/20  Ward, Layla Maw, DO  predniSONE (STERAPRED UNI-PAK 21 TAB) 10 MG (21) TBPK tablet Take as directed.  Start on 08/19/20. 08/18/20   Ward, Layla Maw, DO  rizatriptan (MAXALT) 10 MG tablet Take 1 tablet (10 mg total) by mouth as needed for migraine. May repeat in 2 hours if needed 12/26/18   Margaretann Loveless, PA-C    Family History Family History  Adopted: Yes  Family history unknown: Yes    Social History Social History   Tobacco Use  . Smoking status: Never Smoker  . Smokeless tobacco: Never Used  Vaping Use  . Vaping Use: Never used  Substance Use Topics  . Alcohol use: Yes    Alcohol/week: 5.0 standard drinks    Types: 5 Glasses of wine per week  . Drug use: No     Allergies   Alpha-gal and Doxycycline   Review of Systems Review of Systems  Constitutional: Negative for chills and fever.  HENT: Positive for ear pain. Negative for sore throat.   Eyes: Positive for pain. Negative for visual disturbance.  Respiratory: Negative for cough and shortness of breath.   Cardiovascular: Negative for chest pain and palpitations.  Gastrointestinal: Negative for abdominal pain and vomiting.  Genitourinary: Negative for dysuria and hematuria.  Musculoskeletal: Negative for arthralgias and back pain.  Skin: Negative for color change and rash.  Neurological: Positive  for headaches. Negative for dizziness, seizures, syncope, weakness and numbness.  All other systems reviewed and are negative.    Physical Exam Triage Vital Signs ED Triage Vitals  Enc Vitals Group     BP      Pulse      Resp      Temp      Temp src      SpO2      Weight      Height      Head Circumference      Peak Flow      Pain Score      Pain Loc      Pain Edu?      Excl. in GC?    No data found.  Updated Vital Signs BP 113/70 (BP Location: Left Arm)   Pulse 73   Temp 98.1 F (36.7 C) (Oral)   Resp 18   Wt 175 lb (79.4 kg)   SpO2  99%   BMI 25.11 kg/m   Visual Acuity Right Eye Distance:   Left Eye Distance:   Bilateral Distance:    Right Eye Near:   Left Eye Near:    Bilateral Near:     Physical Exam Vitals and nursing note reviewed.  Constitutional:      General: He is not in acute distress.    Appearance: He is well-developed. He is not ill-appearing.  HENT:     Head: Normocephalic and atraumatic.     Right Ear: Tympanic membrane and ear canal normal.     Left Ear: Tympanic membrane and ear canal normal.     Nose: Nose normal.     Mouth/Throat:     Mouth: Mucous membranes are moist.     Pharynx: Oropharynx is clear.  Eyes:     Conjunctiva/sclera: Conjunctivae normal.     Pupils: Pupils are equal, round, and reactive to light.  Cardiovascular:     Rate and Rhythm: Normal rate and regular rhythm.     Heart sounds: Normal heart sounds.  Pulmonary:     Effort: Pulmonary effort is normal. No respiratory distress.     Breath sounds: Normal breath sounds.  Abdominal:     Palpations: Abdomen is soft.     Tenderness: There is no abdominal tenderness.  Musculoskeletal:     Cervical back: Neck supple.  Skin:    General: Skin is warm and dry.     Findings: No rash.  Neurological:     General: No focal deficit present.     Mental Status: He is alert and oriented to person, place, and time.     Sensory: No sensory deficit.     Motor: No weakness.     Coordination: Romberg sign negative.     Gait: Gait normal.  Psychiatric:        Mood and Affect: Mood normal.        Behavior: Behavior normal.      UC Treatments / Results  Labs (all labs ordered are listed, but only abnormal results are displayed) Labs Reviewed - No data to display  EKG   Radiology No results found.  Procedures Procedures (including critical care time)  Medications Ordered in UC Medications - No data to display  Initial Impression / Assessment and Plan / UC Course  I have reviewed the triage vital signs and the  nursing notes.  Pertinent labs & imaging results that were available during my care of the patient were reviewed by me and considered  in my medical decision making (see chart for details).   Acute non-intractable headache, eye pressure, ear pain.  Patient was started on doxycycline 3 days ago for [facility] spotted fever.  He attributes his symptoms to a reaction to the doxycycline and is concerned for the possibility of intracranial hypertension.  I discussed with him the limitations of evaluation for this in an urgent care setting.  He appears well and his exam is reassuring but due to his concern for intracranial hypertension, sending him to the ED for evaluation.  He feels stable to transport himself to the ED.    Final Clinical Impressions(s) / UC Diagnoses   Final diagnoses:  Acute nonintractable headache, unspecified headache type  Eye pressure  Otalgia of both ears     Discharge Instructions     Go to the emergency department for evaluation of your concern for possible intracranial hypertension.        ED Prescriptions    None     PDMP not reviewed this encounter.   Mickie Bail, NP 08/24/20 1455

## 2020-08-24 NOTE — Telephone Encounter (Signed)
Patient states he does not think he can finish antibiotic. He wants to know Dr. Elisabeth Cara recommendations? Please advise?

## 2020-08-24 NOTE — ED Triage Notes (Signed)
Patient presents to Urgent Care with complaints of bilateral ear pain, headache, bilateral eye pressure increases when laying down. He states symptoms started after starting doxycycline med. On 04/11 prescribed by PCP. He reports some nasal congestion as well that started last night.  He states he spoke with triage RN  who has not followed up with pt regarding possible allergic reaction to med.   Denies SOB, chest pain, or allergic rash.

## 2020-08-25 NOTE — Telephone Encounter (Signed)
Okay, if he is otherwise feeling okay with no fever and headache would just stop it.  If he has fever headache rash she will need to be reevaluated if he is not taking the doxycycline

## 2020-08-25 NOTE — Telephone Encounter (Signed)
Patient was advised.  

## 2020-09-02 ENCOUNTER — Encounter: Payer: Self-pay | Admitting: Physician Assistant

## 2020-09-02 ENCOUNTER — Other Ambulatory Visit: Payer: Self-pay | Admitting: Physician Assistant

## 2020-09-02 DIAGNOSIS — Z91018 Allergy to other foods: Secondary | ICD-10-CM

## 2020-10-13 ENCOUNTER — Other Ambulatory Visit: Payer: Self-pay | Admitting: Allergy

## 2020-10-13 ENCOUNTER — Ambulatory Visit
Admission: RE | Admit: 2020-10-13 | Discharge: 2020-10-13 | Disposition: A | Discharge status: Home / Self Care | Payer: BC Managed Care – PPO | Source: Ambulatory Visit | Attending: Allergy | Admitting: Allergy

## 2020-10-13 ENCOUNTER — Ambulatory Visit
Admission: RE | Admit: 2020-10-13 | Discharge: 2020-10-13 | Disposition: A | Discharge status: Home / Self Care | Payer: BC Managed Care – PPO | Attending: Allergy | Admitting: Allergy

## 2020-10-13 DIAGNOSIS — R059 Cough, unspecified: Secondary | ICD-10-CM

## 2020-10-13 IMAGING — CR DG CHEST 2V
1 series · 3 of 3 positions shown · non-contrast
Comparison: [DATE]

CLINICAL DATA: Cough, shortness of breath, chest pressure

EXAM:
CHEST - 2 VIEW

[Series 1: dg chest 2 view · 0.14mm/px · 3 of 3 slices shown]
[im 1/3]
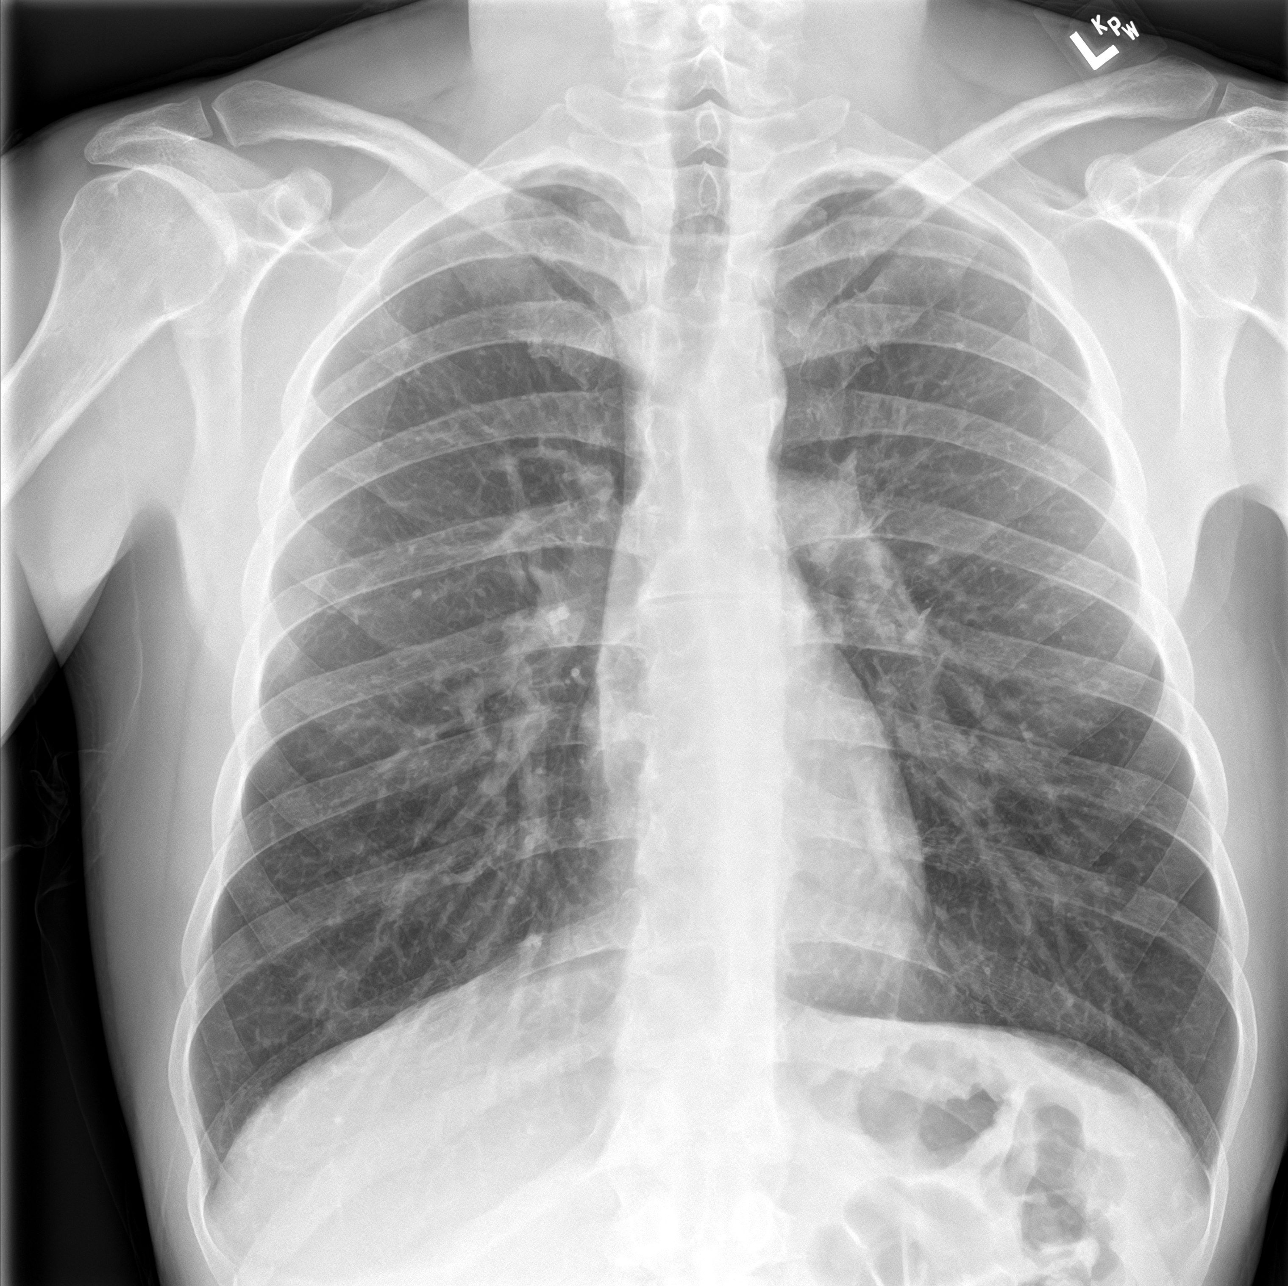
[im 2/3]
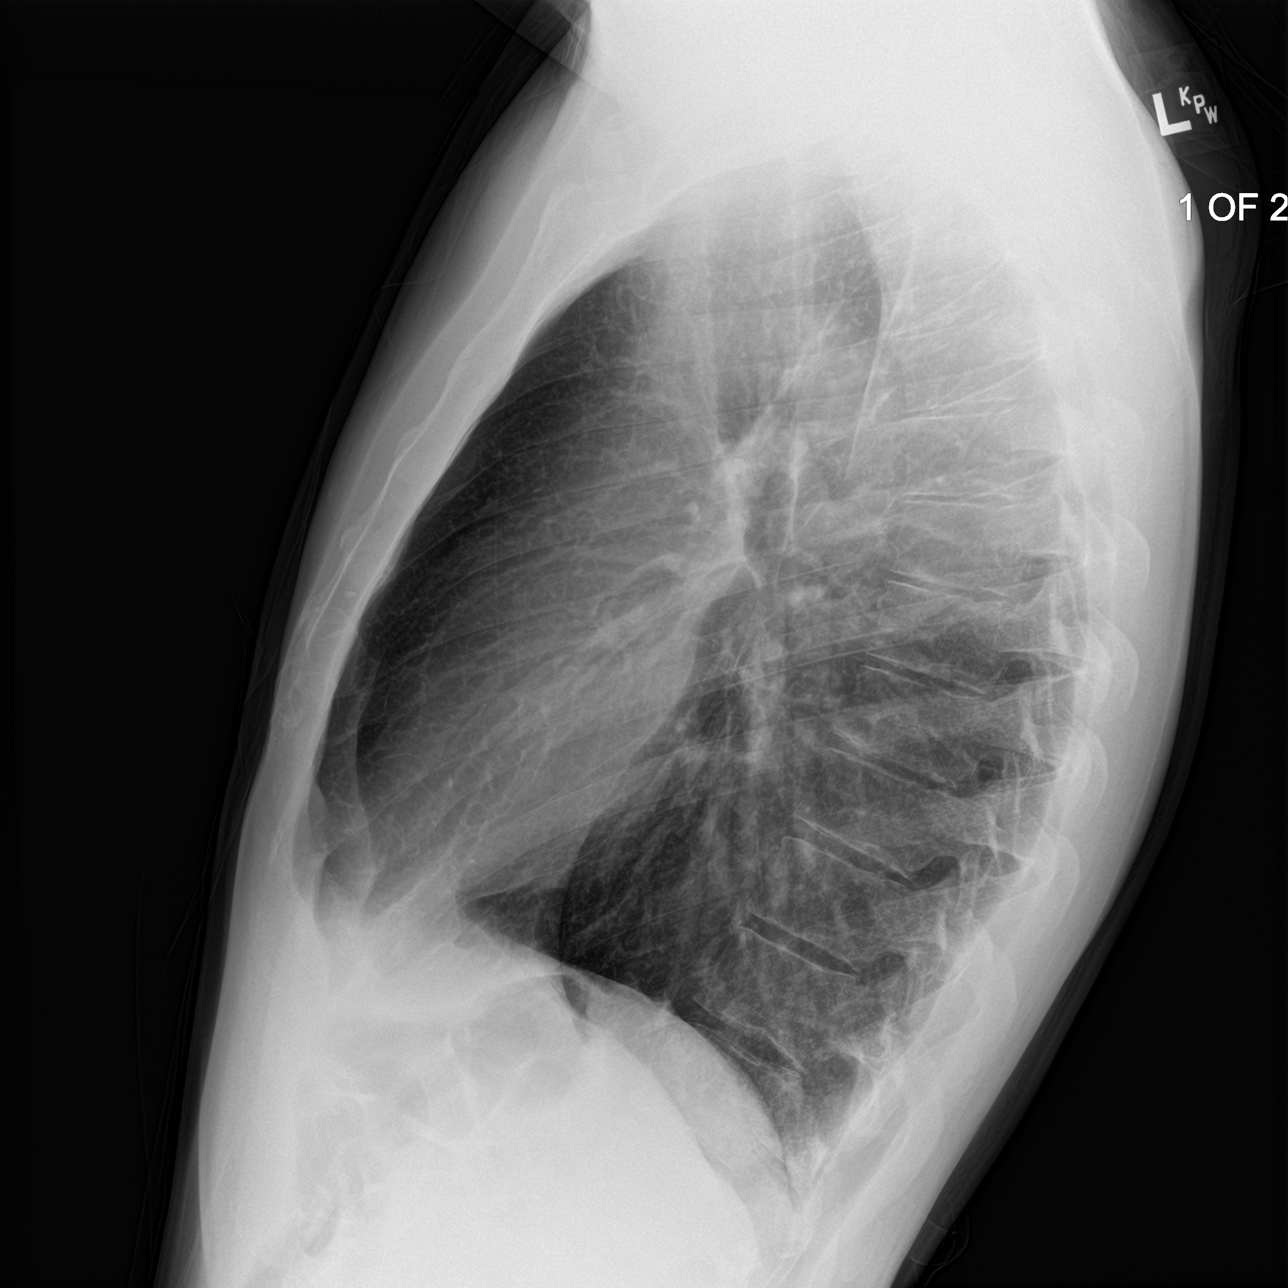
[im 3/3]
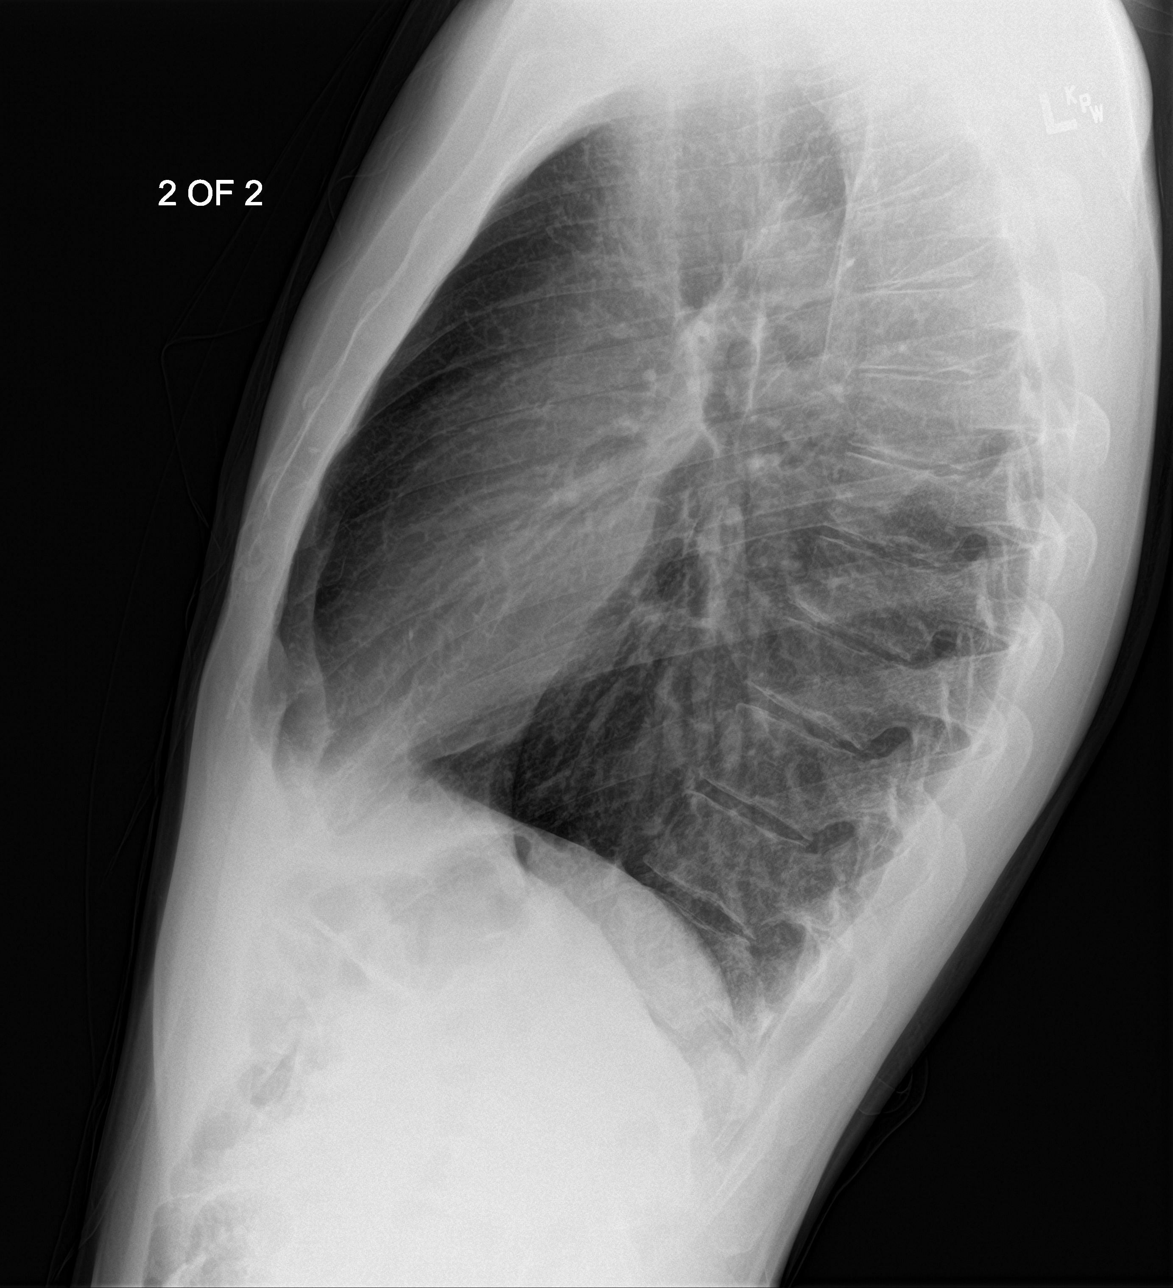

[3 of 3 positions shown; findings below may reference images not displayed]

FINDINGS: The heart size and mediastinal contours are within normal limits.
Both lungs are clear. The visualized skeletal structures are
unremarkable.
IMPRESSION: No active cardiopulmonary disease.

## 2020-12-27 ENCOUNTER — Other Ambulatory Visit: Payer: Self-pay | Admitting: Neurology

## 2020-12-27 DIAGNOSIS — G43119 Migraine with aura, intractable, without status migrainosus: Secondary | ICD-10-CM

## 2021-01-03 ENCOUNTER — Ambulatory Visit
Admission: RE | Admit: 2021-01-03 | Discharge: 2021-01-03 | Disposition: A | Discharge status: Home / Self Care | Payer: BC Managed Care – PPO | Source: Ambulatory Visit | Attending: Neurology | Admitting: Neurology

## 2021-01-03 ENCOUNTER — Other Ambulatory Visit: Payer: Self-pay

## 2021-01-03 DIAGNOSIS — G43119 Migraine with aura, intractable, without status migrainosus: Secondary | ICD-10-CM

## 2021-01-03 IMAGING — MR MR HEAD WO/W CM
13 series · 47 of 48 positions shown · IV contrast (7ml Gadavist)
Comparison: Temporal bone CT same day.

CLINICAL DATA: Headache over the last week, better with recumbency.
Question CSF leak.

EXAM:
MRI HEAD WITHOUT AND WITH CONTRAST
MRA HEAD WITHOUT CONTRAST
TECHNIQUE: Multiplanar, multi-echo pulse sequences of the brain and surrounding
structures were acquired without and with intravenous contrast.
Angiographic images of the Circle of Willis were acquired using MRA
technique without intravenous contrast.
CONTRAST:  7mL GADAVIST GADOBUTROL 1 MMOL/ML IV SOLN

[Series 5: ax dwi_tracew · axial · 3.0mm · 0.65mm/px · z∈[-86,+67]mm · 4 of 48 slices shown]
[im 1/48]
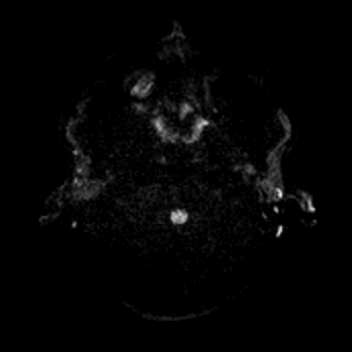
[im 16/48]
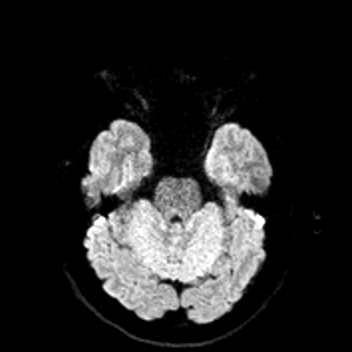
[im 32/48]
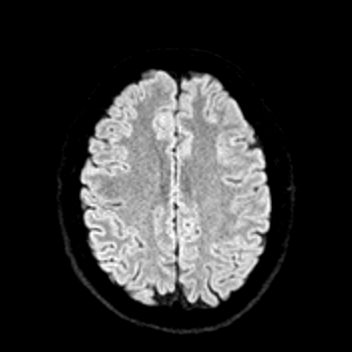
[im 48/48]
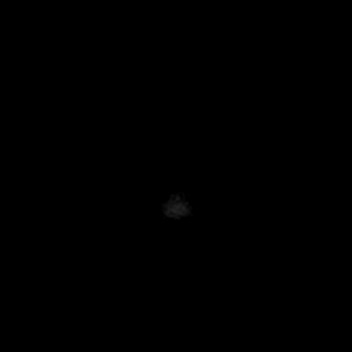

[Series 6: ax dwi_adc · axial · 3.0mm · 0.65mm/px · z∈[-86,+67]mm · 3 of 48 slices shown]
[im 1/48]
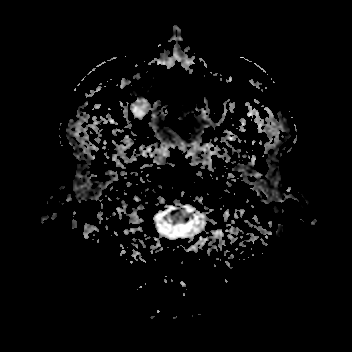
[im 24/48]
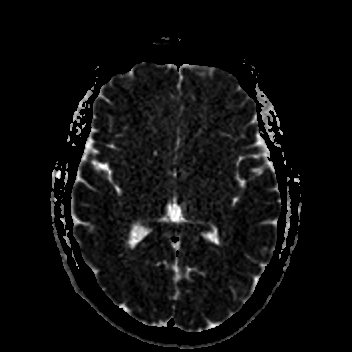
[im 48/48]
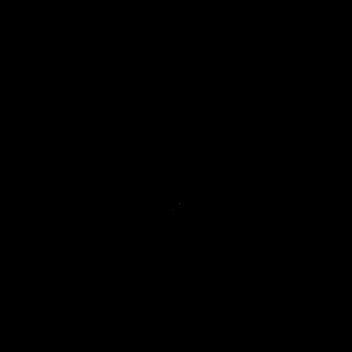

[Series 7: cor dwi_tracew · coronal · 5.0mm · 0.65mm/px · 2 of 40 slices shown]
[im 1/40]
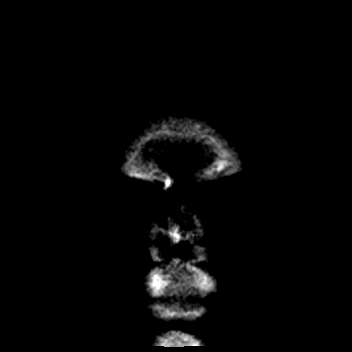
[im 40/40]
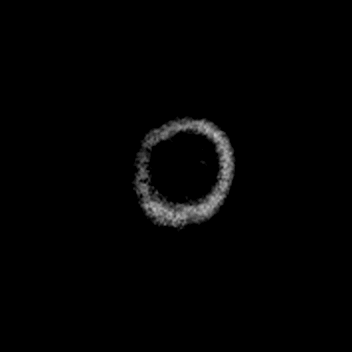

[Series 8: cor dwi_adc · coronal · 5.0mm · 0.65mm/px · 2 of 40 slices shown]
[im 1/40]
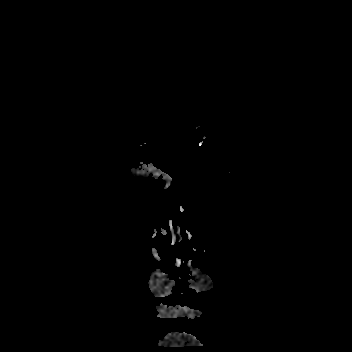
[im 40/40]
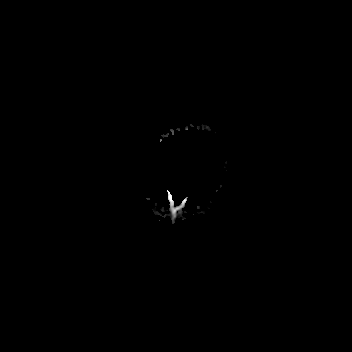

[Series 14: T1 · sagittal · 5.0mm · 0.62mm/px · 1 of 24 slices shown (1 of 2)]
[im 1/24]
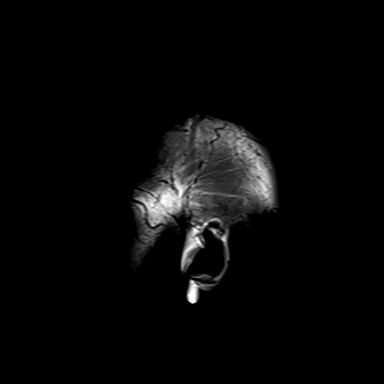

[Series 15: T2 · axial · 5.0mm · 0.53mm/px · z∈[-83,+65]mm · 2 of 26 slices shown (1 of 2)]
[im 1/26]
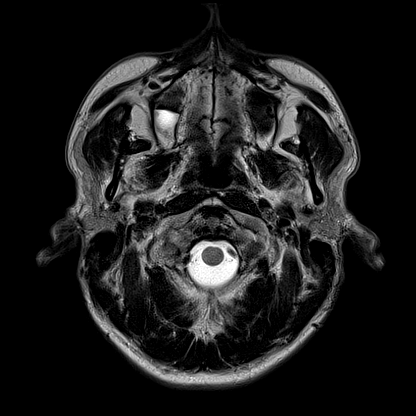
[im 26/26]
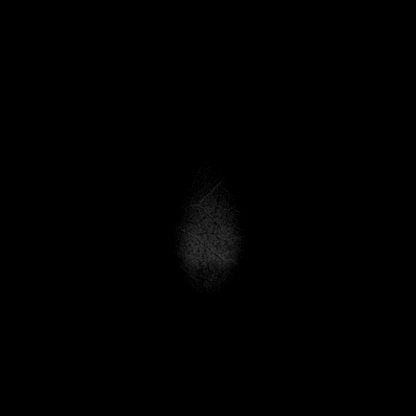

[Series 17: pha_images · axial · 3.0mm · 0.90mm/px · z∈[-96,+79]mm · 3 of 57 slices shown]
[im 1/57]
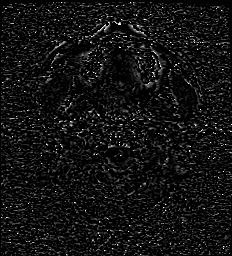
[im 29/57]
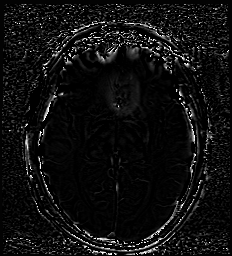
[im 57/57]
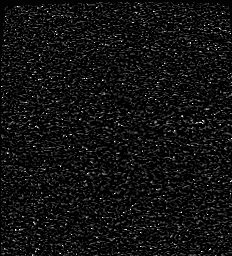

[Series 18: swi_images · axial · 3.0mm · 0.90mm/px · z∈[-96,+79]mm · 4 of 60 slices shown]
[im 1/60]
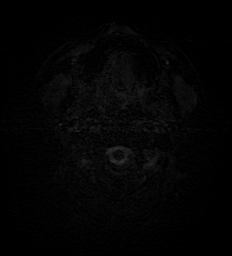
[im 20/60]
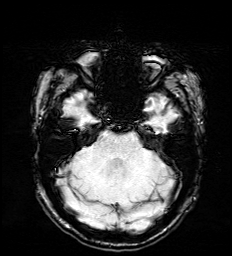
[im 40/60]
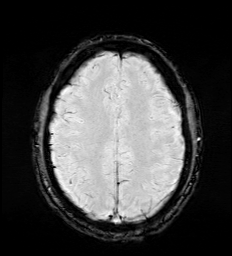
[im 60/60]
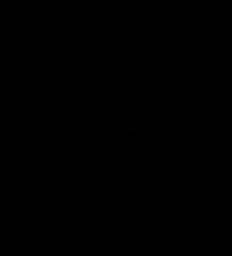

[Series 20: FLAIR · axial · 3.0mm · 0.53mm/px · z∈[-89,+71]mm · 3 of 55 slices shown]
[im 1/55]
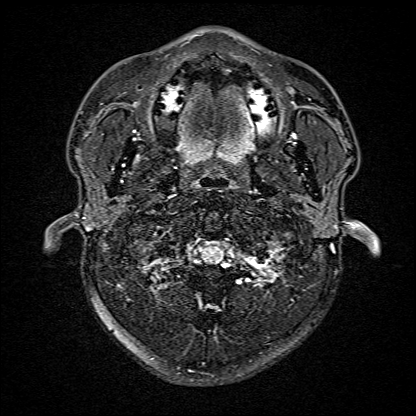
[im 28/55]
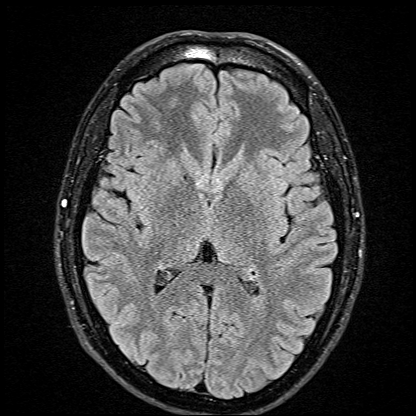
[im 55/55]
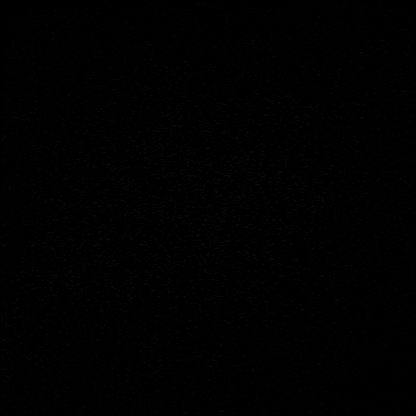

[Series 21: T1 · axial · 1.0mm · 0.98mm/px · z∈[-97,+76]mm · 9 of 176 slices shown (2 of 2)]
[im 1/176]
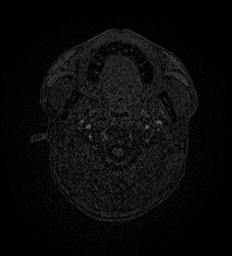
[im 20/176]
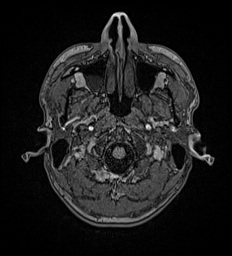
[im 39/176]
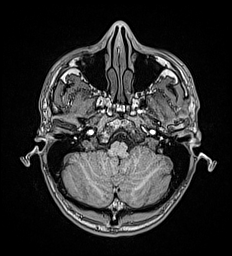
[im 59/176]
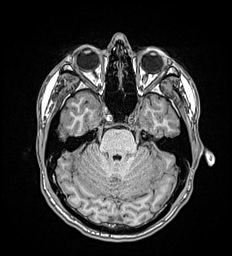
[im 78/176]
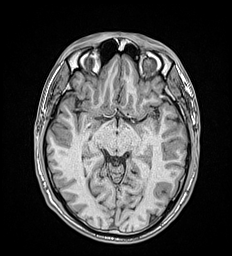
[im 98/176]
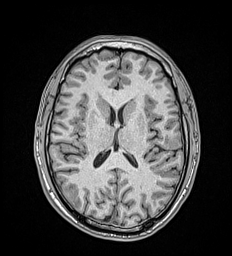
[im 117/176]
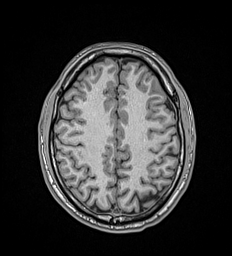
[im 156/176]
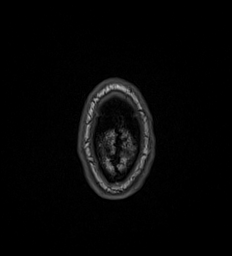
[im 176/176]
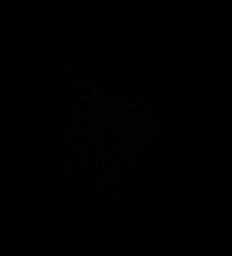

[Series 22: T2 · coronal · 5.0mm · 0.45mm/px · 2 of 31 slices shown (2 of 2)]
[im 1/31]
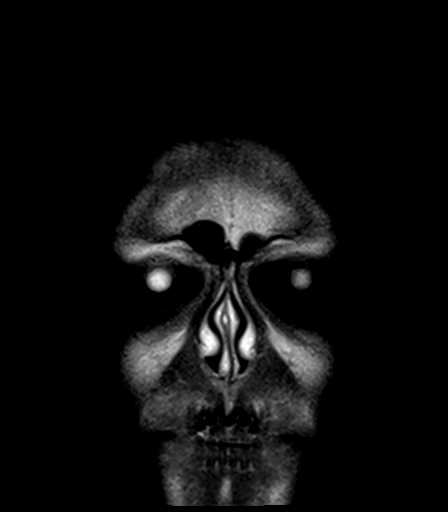
[im 31/31]
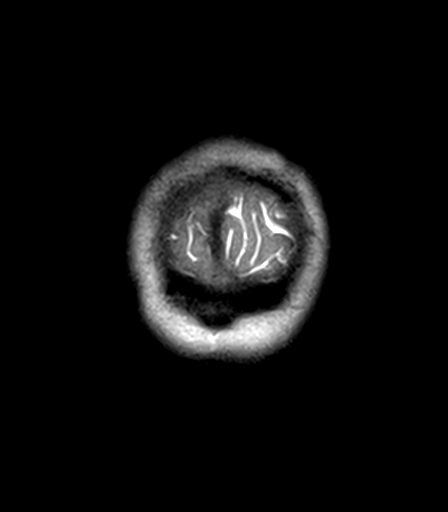

[Series 23: T1 post-contrast · axial · 1.0mm · 0.98mm/px · z∈[-97,+76]mm · 10 of 175 slices shown (1 of 2)]
[im 1/175]
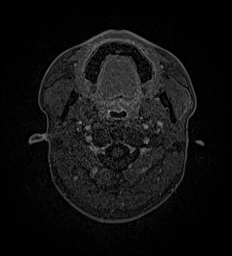
[im 20/175]
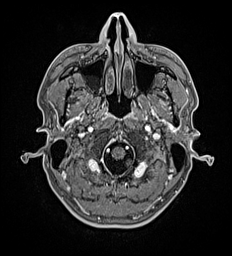
[im 39/175]
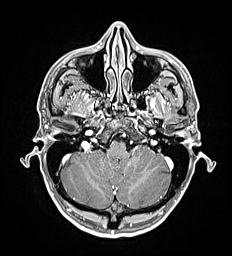
[im 59/175]
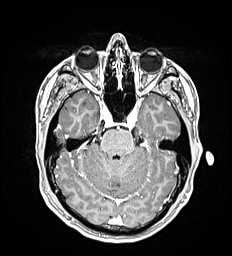
[im 78/175]
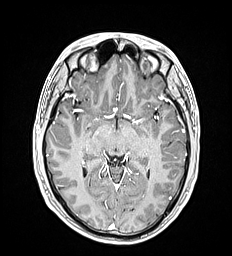
[im 97/175]
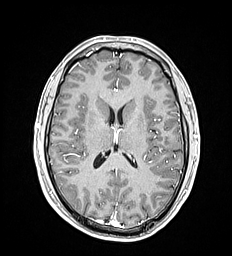
[im 117/175]
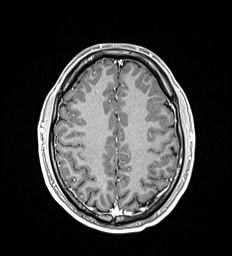
[im 136/175]
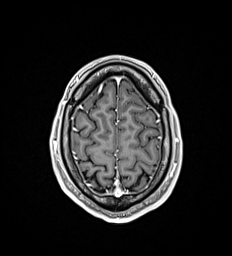
[im 155/175]
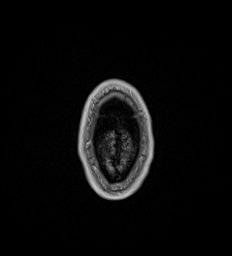
[im 175/175]
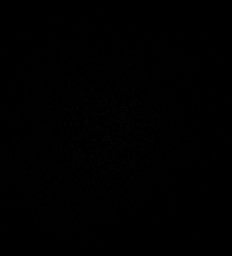

[Series 24: T1 post-contrast · coronal · 5.0mm · 0.57mm/px · 2 of 31 slices shown (2 of 2)]
[im 1/31]
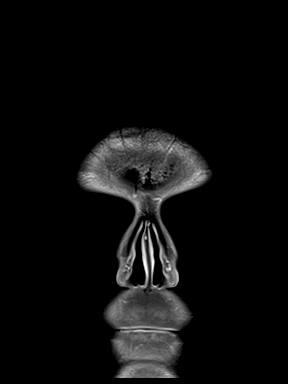
[im 31/31]
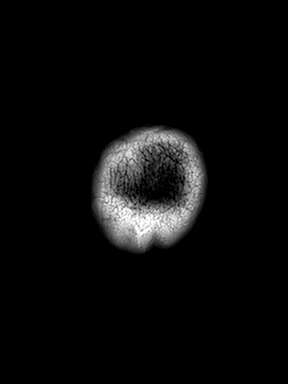

[47 of 48 positions shown; findings below may reference images not displayed]

FINDINGS: MRI HEAD FINDINGS

Brain: The brain has a normal appearance without evidence of
malformation, atrophy, old or acute small or large vessel
infarction, mass lesion, hemorrhage, hydrocephalus or extra-axial
collection. After contrast administration, no abnormal enhancement
occurs. There is no finding on this study to suggest CSF leak.

Vascular: Major vessels at the base of the brain show flow. Venous
sinuses appear patent.

Skull and upper cervical spine: Normal.

Sinuses/Orbits: Insignificant retention cyst in the right maxillary
sinus. No layering fluid seen in any of the sinuses. Orbits
negative.

Other: None significant.

MRA HEAD FINDINGS

Both internal carotid arteries are widely patent into the brain. No
siphon stenosis. The anterior and middle cerebral vessels are patent
without proximal stenosis, aneurysm or vascular malformation.

Both vertebral arteries are widely patent to the basilar. No basilar
stenosis. Posterior circulation branch vessels appear normal.
IMPRESSION: Normal MRI of the brain. No finding to suggest CSF leak or
intracranial hypotension.

Normal intracranial MR angiography of the large and medium sized
vessels.

## 2021-01-03 IMAGING — MR MR MRA HEAD W/O CM
1 series · 19 of 48 positions shown · IV contrast (gadavist)
Comparison: Temporal bone CT same day.

CLINICAL DATA: Headache over the last week, better with recumbency.
Question CSF leak.

EXAM:
MRI HEAD WITHOUT AND WITH CONTRAST
MRA HEAD WITHOUT CONTRAST
TECHNIQUE: Multiplanar, multi-echo pulse sequences of the brain and surrounding
structures were acquired without and with intravenous contrast.
Angiographic images of the Circle of Willis were acquired using MRA
technique without intravenous contrast.
CONTRAST:  7mL GADAVIST GADOBUTROL 1 MMOL/ML IV SOLN

[Series 9: TOF · axial · 0.5mm · 0.41mm/px · z∈[-83,+14]mm · 19 of 205 slices shown]
[im 1/205]
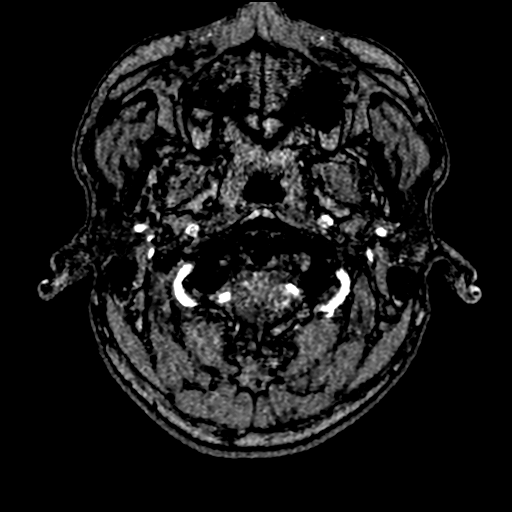
[im 5/205]
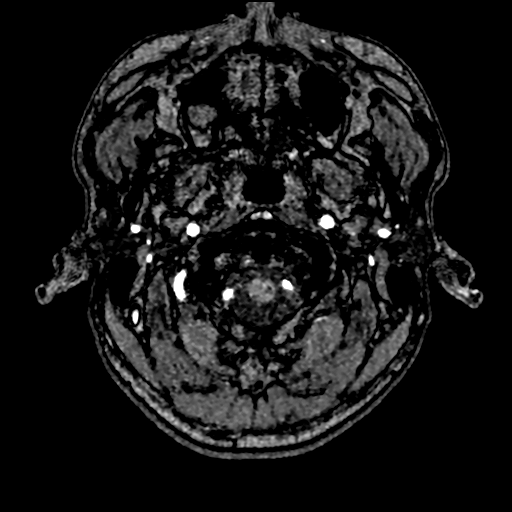
[im 9/205]
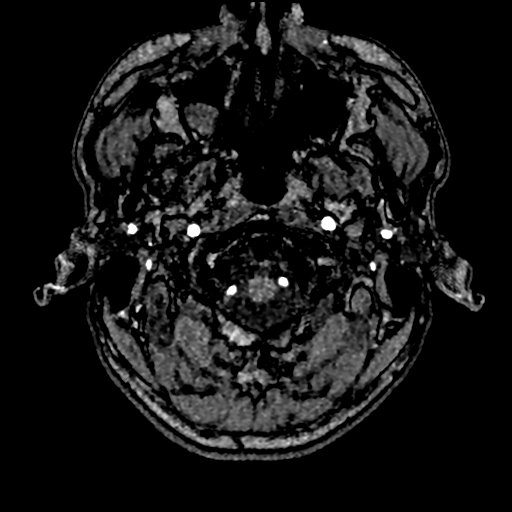
[im 14/205]
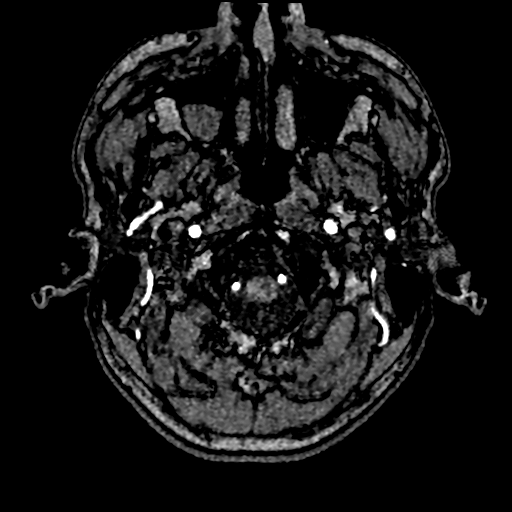
[im 18/205]
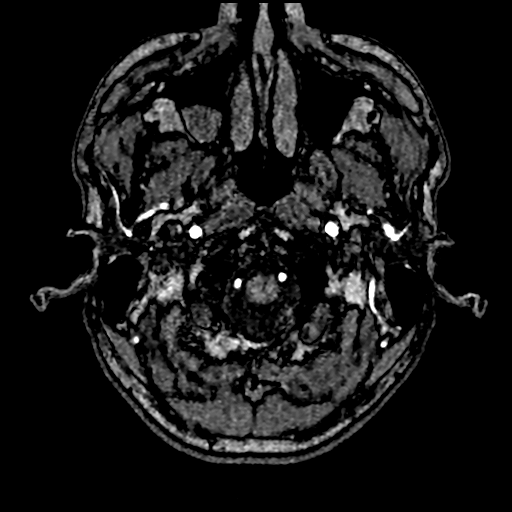
[im 22/205]
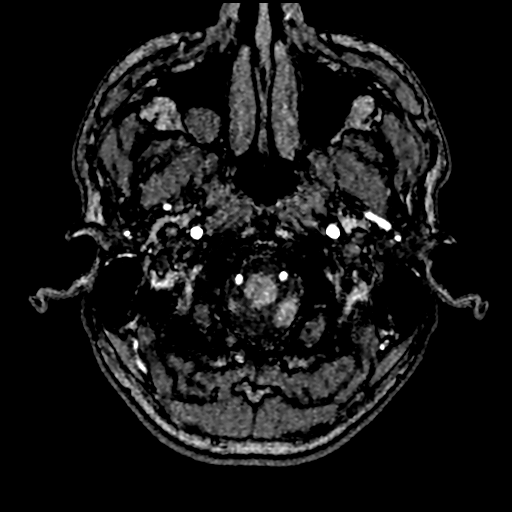
[im 27/205]
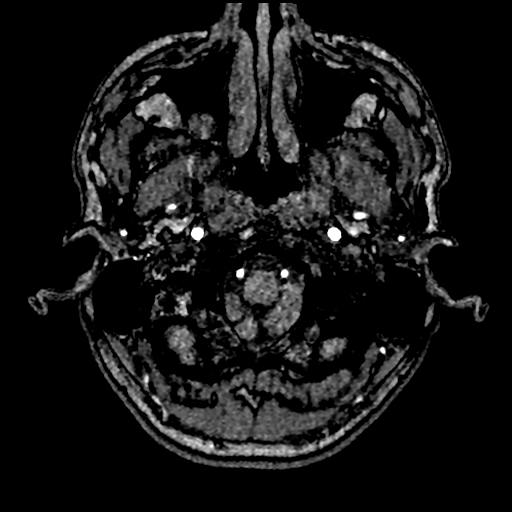
[im 31/205]
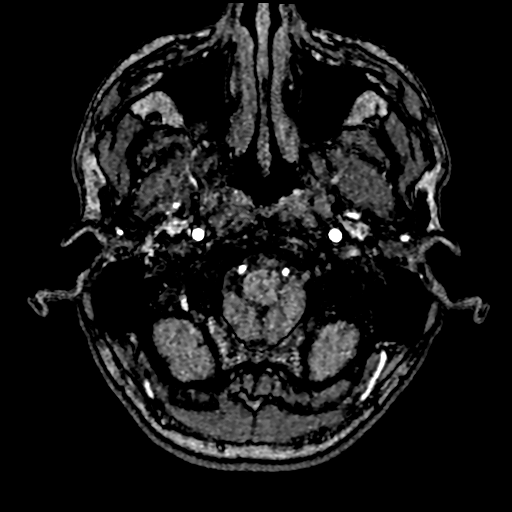
[im 35/205]
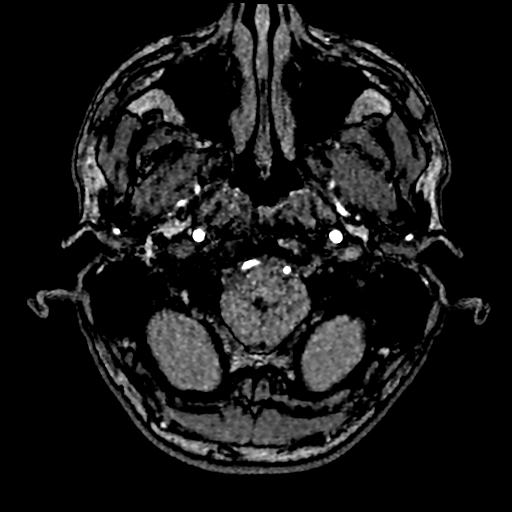
[im 40/205]
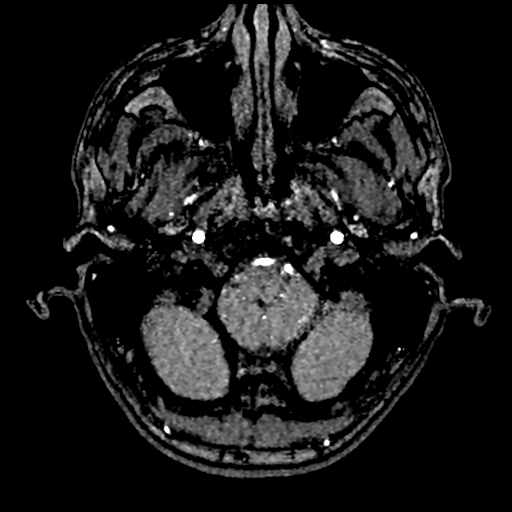
[im 44/205]
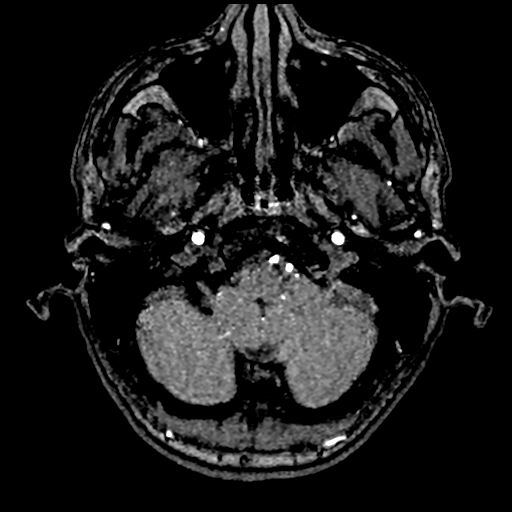
[im 66/205]
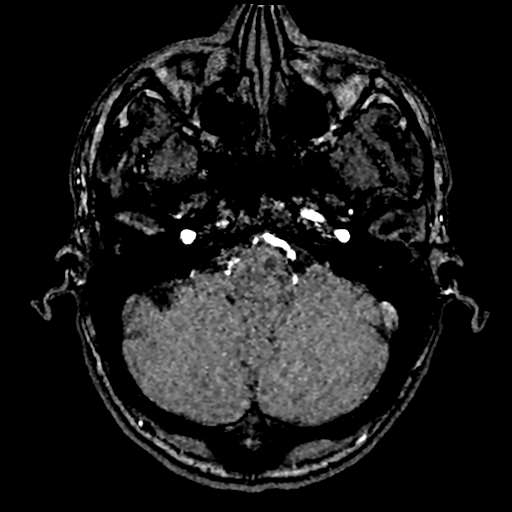
[im 92/205]
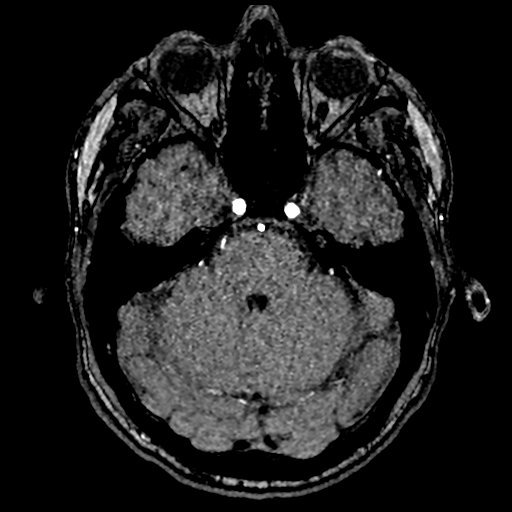
[im 105/205]
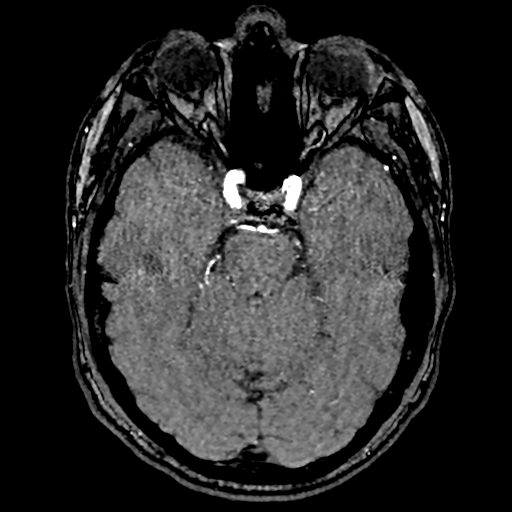
[im 118/205]
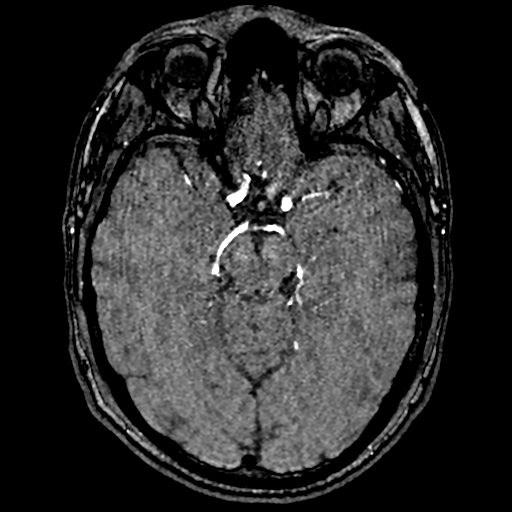
[im 144/205]
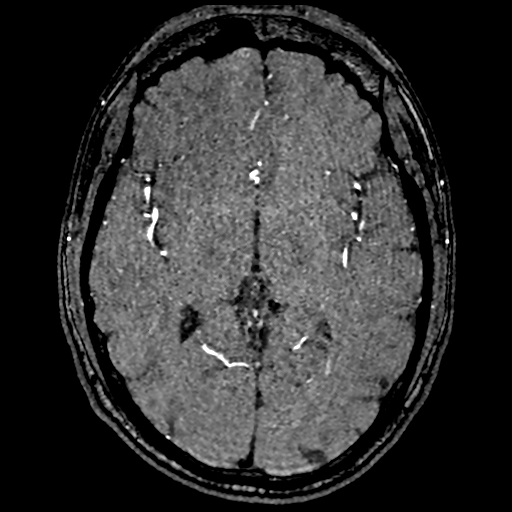
[im 170/205]
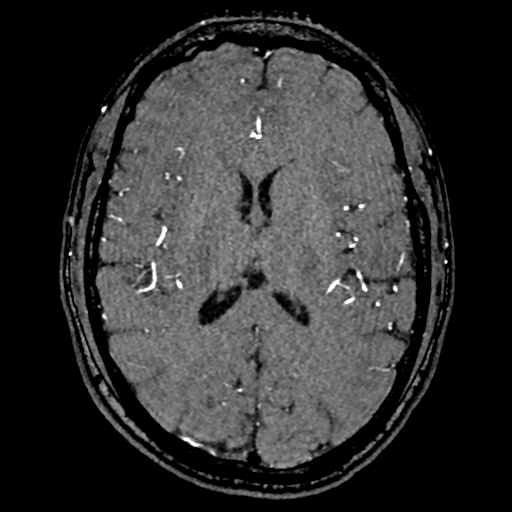
[im 174/205]
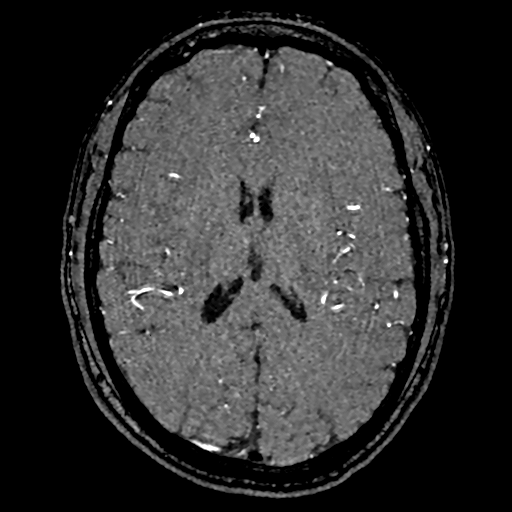
[im 196/205]
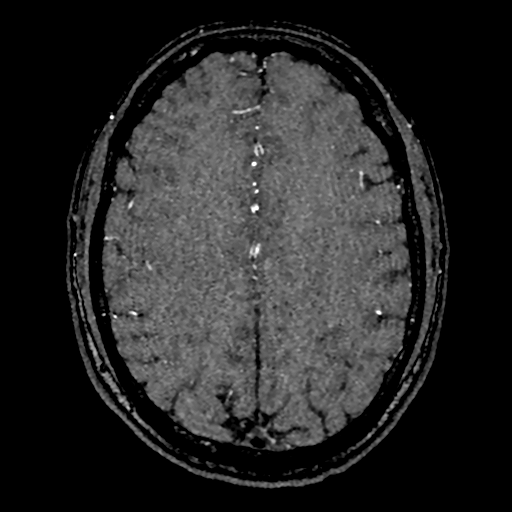

[19 of 48 positions shown; findings below may reference images not displayed]

FINDINGS: MRI HEAD FINDINGS

Brain: The brain has a normal appearance without evidence of
malformation, atrophy, old or acute small or large vessel
infarction, mass lesion, hemorrhage, hydrocephalus or extra-axial
collection. After contrast administration, no abnormal enhancement
occurs. There is no finding on this study to suggest CSF leak.

Vascular: Major vessels at the base of the brain show flow. Venous
sinuses appear patent.

Skull and upper cervical spine: Normal.

Sinuses/Orbits: Insignificant retention cyst in the right maxillary
sinus. No layering fluid seen in any of the sinuses. Orbits
negative.

Other: None significant.

MRA HEAD FINDINGS

Both internal carotid arteries are widely patent into the brain. No
siphon stenosis. The anterior and middle cerebral vessels are patent
without proximal stenosis, aneurysm or vascular malformation.

Both vertebral arteries are widely patent to the basilar. No basilar
stenosis. Posterior circulation branch vessels appear normal.
IMPRESSION: Normal MRI of the brain. No finding to suggest CSF leak or
intracranial hypotension.

Normal intracranial MR angiography of the large and medium sized
vessels.

## 2021-01-03 MED ORDER — GADOBUTROL 1 MMOL/ML IV SOLN
7.0000 mL | Freq: Once | INTRAVENOUS | Status: AC | PRN
  Administered 2021-01-03: 7 mL via INTRAVENOUS

## 2021-02-21 ENCOUNTER — Ambulatory Visit: Admit: 2021-02-21 | Payer: BC Managed Care – PPO

## 2021-02-21 ENCOUNTER — Ambulatory Visit: Payer: Self-pay

## 2021-02-21 NOTE — Telephone Encounter (Signed)
Pt. Reports "cols symptoms x 10 days." Has cough, fever and recently developed headache and stiff neck. History of viral meningitis. No availability in the practice today. Will go to UC.    Reason for Disposition  Patient sounds very sick or weak to the triager  Answer Assessment - Initial Assessment Questions 1. ONSET: "When did the pain begin?"      10 days ago 2. LOCATION: "Where does it hurt?"      Neck 3. PATTERN "Does the pain come and go, or has it been constant since it started?"      Comes and goes 4. SEVERITY: "How bad is the pain?"  (Scale 1-10; or mild, moderate, severe)   - NO PAIN (0): no pain or only slight stiffness    - MILD (1-3): doesn't interfere with normal activities    - MODERATE (4-7): interferes with normal activities or awakens from sleep    - SEVERE (8-10):  excruciating pain, unable to do any normal activities      Moderate 5. RADIATION: "Does the pain go anywhere else, shoot into your arms?"     No 6. CORD SYMPTOMS: "Any weakness or numbness of the arms or legs?"     No 7. CAUSE: "What do you think is causing the neck pain?"     Unsure 8. NECK OVERUSE: "Any recent activities that involved turning or twisting the neck?"     No 9. OTHER SYMPTOMS: "Do you have any other symptoms?" (e.g., headache, fever, chest pain, difficulty breathing, neck swelling)     Fever, productive cough 10. PREGNANCY: "Is there any chance you are pregnant?" "When was your last menstrual period?"       N/a  Protocols used: Neck Pain or Stiffness-A-AH

## 2021-03-16 ENCOUNTER — Other Ambulatory Visit: Payer: Self-pay

## 2021-03-16 ENCOUNTER — Ambulatory Visit (LOCAL_COMMUNITY_HEALTH_CENTER): Payer: BC Managed Care – PPO

## 2021-03-16 DIAGNOSIS — Z23 Encounter for immunization: Secondary | ICD-10-CM | POA: Diagnosis not present

## 2021-03-16 NOTE — Progress Notes (Signed)
In Nurse Clinic for flu vaccine. Pt reports hx alpha gal syndrome and says allergist recommends flublok vaccine. Phone consult Ralene Bathe, MD who gives ok to give flublok today. RN administered flublok and pt tolerated well. Updated [facility] copy given. Jerel Shepherd, RN

## 2021-03-16 NOTE — Progress Notes (Signed)
Attestation: I agree with the advice given to this patient by our nurse staff.  I have reviewed the RN's note and chart. Documentation reflects my recommendations which were given verbally to the nurse at the point of care.   Alante Weimann Niles Juvenal Umar, MD, MPH, ABFM Medical Director  [facility]  

## 2021-08-18 ENCOUNTER — Encounter: Payer: Self-pay | Admitting: Emergency Medicine

## 2021-08-18 ENCOUNTER — Ambulatory Visit
Admission: EM | Admit: 2021-08-18 | Discharge: 2021-08-18 | Disposition: A | Discharge status: Home / Self Care | Payer: BC Managed Care – PPO | Attending: Emergency Medicine | Admitting: Emergency Medicine

## 2021-08-18 DIAGNOSIS — S00411A Abrasion of right ear, initial encounter: Secondary | ICD-10-CM

## 2021-08-18 MED ORDER — OFLOXACIN 0.3 % OT SOLN: 10.0000 [drp] | Freq: Two times a day (BID) | OTIC | 0 refills | Status: AC

## 2021-08-18 NOTE — ED Triage Notes (Signed)
Pt here with blood draining from right ear since this morning. No injury of note.  ?

## 2021-08-18 NOTE — Discharge Instructions (Addendum)
Use the antibiotic ear drops as directed.  Follow up with your primary care provider if your symptoms are not improving.   ? ?

## 2021-08-18 NOTE — ED Provider Notes (Signed)
?[facility] ? ? ? ?CSN: [id number] ?Arrival date & time: 08/18/21  0867 ? ? ?  ? ?History   ?Chief Complaint ?Chief Complaint  ?Patient presents with  ? Ear Drainage  ? ? ?HPI ?Brett Stephenson is a 36 y.o. male.  Patient presents with dried blood in his right ear canal this morning.  He woke up this morning and noted dried blood in the canal when he rubbed it with his finger.  No known injury.  He denies ear pain, change in hearing, fever, chills, sore throat, cough, or other symptoms.  No treatments at home. ? ?The history is provided by the patient.  ? ?Past Medical History:  ?Diagnosis Date  ? Migraine   ? ? ?Patient Active Problem List  ? Diagnosis Date Noted  ? Viral meningitis 06/08/2016  ? Migraines 11/01/2014  ? ? ?History reviewed. No pertinent surgical history. ? ? ? ? ?Home Medications   ? ?Prior to Admission medications   ?Medication Sig Start Date End Date Taking? Authorizing Provider  ?ofloxacin (FLOXIN) 0.3 % OTIC solution Place 10 drops into the right ear 2 (two) times daily. 08/18/21  Yes Mickie Bail, NP  ?doxycycline (VIBRA-TABS) 100 MG tablet Take 1 tablet (100 mg total) by mouth 2 (two) times daily. ?Patient not taking: Reported on 03/16/2021 08/21/20   Margaretann Loveless, PA-C  ?EPINEPHrine 0.3 mg/0.3 mL IJ SOAJ injection Inject 0.3 mg into the muscle as needed for anaphylaxis. 08/18/20   Ward, Layla Maw, DO  ?famotidine (PEPCID) 20 MG tablet Take 1 tablet (20 mg total) by mouth 2 (two) times daily for 14 days. 08/18/20 09/01/20  Ward, Layla Maw, DO  ?predniSONE (STERAPRED UNI-PAK 21 TAB) 10 MG (21) TBPK tablet Take as directed.  Start on 08/19/20. ?Patient not taking: Reported on 03/16/2021 08/18/20   Ward, Layla Maw, DO  ?rizatriptan (MAXALT) 10 MG tablet Take 1 tablet (10 mg total) by mouth as needed for migraine. May repeat in 2 hours if needed ?Patient not taking: Reported on 03/16/2021 12/26/18   Margaretann Loveless, PA-C  ?topiramate (TOPAMAX) 25 MG capsule Take 25 mg by mouth 2 (two) times  daily. ?Patient not taking: Reported on 03/16/2021    [provider]  ? ? ?Family History ?Family History  ?Adopted: Yes  ?Family history unknown: Yes  ? ? ?Social History ?Social History  ? ?Tobacco Use  ? Smoking status: Never  ? Smokeless tobacco: Never  ?Vaping Use  ? Vaping Use: Never used  ?Substance Use Topics  ? Alcohol use: Yes  ?  Alcohol/week: 5.0 standard drinks  ?  Types: 5 Glasses of wine per week  ? Drug use: No  ? ? ? ?Allergies   ?Alpha-gal and Doxycycline ? ? ?Review of Systems ?Review of Systems  ?Constitutional:  Negative for chills and fever.  ?HENT:  Positive for ear discharge. Negative for ear pain, hearing loss and sore throat.   ?Respiratory:  Negative for cough and shortness of breath.   ?Cardiovascular:  Negative for chest pain and palpitations.  ?All other systems reviewed and are negative. ? ? ?Physical Exam ?Triage Vital Signs ?ED Triage Vitals  ?Enc Vitals Group  ?   BP 08/18/21 0926 117/65  ?   Pulse Rate 08/18/21 0926 70  ?   Resp 08/18/21 0926 18  ?   Temp 08/18/21 0926 98.4 ?F (36.9 ?C)  ?   Temp src --   ?   SpO2 08/18/21 0926 99 %  ?  Weight --   ?   Height --   ?   Head Circumference --   ?   Peak Flow --   ?   Pain Score 08/18/21 0918 0  ?   Pain Loc --   ?   Pain Edu? --   ?   Excl. in GC? --   ? ?No data found. ? ?Updated Vital Signs ?BP 117/65   Pulse 70   Temp 98.4 ?F (36.9 ?C)   Resp 18   SpO2 99%  ? ?Visual Acuity ?Right Eye Distance:   ?Left Eye Distance:   ?Bilateral Distance:   ? ?Right Eye Near:   ?Left Eye Near:    ?Bilateral Near:    ? ?Physical Exam ?Vitals and nursing note reviewed.  ?Constitutional:   ?   General: He is not in acute distress. ?   Appearance: Normal appearance. He is well-developed. He is not ill-appearing.  ?HENT:  ?   Right Ear: Tympanic membrane normal. Laceration and drainage present.  ?   Left Ear: Tympanic membrane and ear canal normal.  ?   Ears:  ?   Comments: Small abrasion of anterior right ear canal with small amount of  dried blood. No active bleeding. No purulent drainage or edema.  ?   Nose: Nose normal.  ?   Mouth/Throat:  ?   Mouth: Mucous membranes are moist.  ?   Pharynx: Oropharynx is clear.  ?Cardiovascular:  ?   Rate and Rhythm: Normal rate and regular rhythm.  ?   Heart sounds: Normal heart sounds.  ?Pulmonary:  ?   Effort: Pulmonary effort is normal. No respiratory distress.  ?   Breath sounds: Normal breath sounds.  ?Musculoskeletal:  ?   Cervical back: Neck supple.  ?Skin: ?   General: Skin is warm and dry.  ?Neurological:  ?   Mental Status: He is alert.  ?Psychiatric:     ?   Mood and Affect: Mood normal.     ?   Behavior: Behavior normal.  ? ? ? ?UC Treatments / Results  ?Labs ?(all labs ordered are listed, but only abnormal results are displayed) ?Labs Reviewed - No data to display ? ?EKG ? ? ?Radiology ?No results found. ? ?Procedures ?Procedures (including critical care time) ? ?Medications Ordered in UC ?Medications - No data to display ? ?Initial Impression / Assessment and Plan / UC Course  ?I have reviewed the triage vital signs and the nursing notes. ? ?Pertinent labs & imaging results that were available during my care of the patient were reviewed by me and considered in my medical decision making (see chart for details). ? ?  ?Abrasion in right ear canal.  Treating with antibiotic ear drop, ofloxacin.  Education provided on abrasions.  Instructed patient to follow-up with his PCP as needed.  He agrees to plan of care. ? ?Final Clinical Impressions(s) / UC Diagnoses  ? ?Final diagnoses:  ?Abrasion of right ear canal, initial encounter  ? ? ? ?Discharge Instructions   ? ?  ?Use the antibiotic ear drops as directed.  Follow up with your primary care provider if your symptoms are not improving.   ? ? ? ? ? ?ED Prescriptions   ? ? Medication Sig Dispense Auth. Provider  ? ofloxacin (FLOXIN) 0.3 % OTIC solution Place 10 drops into the right ear 2 (two) times daily. 5 mL Mickie Bail, NP  ? ?  ? ?PDMP not  reviewed this encounter. ?  ?  Mickie Bailate, Florence Antonelli H, NP ?08/18/21 [id number] ? ?

## 2021-09-08 ENCOUNTER — Ambulatory Visit (LOCAL_COMMUNITY_HEALTH_CENTER): Payer: BC Managed Care – PPO

## 2021-09-08 DIAGNOSIS — Z23 Encounter for immunization: Secondary | ICD-10-CM | POA: Diagnosis not present

## 2021-09-08 NOTE — Progress Notes (Signed)
Patient arrive at clinic for Covid Vaccine BV booster.   ? ?Are you feeling sick today? No ? ?Have you ever received a dose of COVID-19 Vaccine? [employer], [place], [place], [place], Other)  YES ? ?If yes, which vaccine and how many doses?    ?Pfizer,  3 doses of Monovalent,  Reviewed Dates as listed in [facility], He verified he had 2 doses at [facility] and one here at the [facility].  There is no documentation of ever receiving a BV COVID-19 vaccines.   ? ?Did you bring the vaccination record card or other documentation?  No ? ? ?Do you have a health condition or are undergoing treatment that makes you moderately or severely immunocompromised? This would include, but not be limited to: cancer, HIV, organ transplant, immunosuppressive therapy/high-dose corticosteroids, or moderate/severe primary immunodeficiency.  No ? ?Have you received COVID-19 vaccine before or during hematopoietic cell transplant (HCT) or CAR-T-cell therapies? No ? ?Have you ever had an allergic reaction to: (This would include a severe allergic reaction or a reaction that caused hives, swelling, or respiratory distress, including wheezing.) A component of a COVID-19 vaccine or a previous dose of COVID-19 vaccine? No ? ? ?Have you ever had an allergic reaction to another vaccine (other thanCOVID-19 vaccine) or an injectable medication? (This would include a severe allergic reaction or a reaction that caused hives, swelling, or respiratory distress, including wheezing.)   Yes Doxcycline and history of Alpha-Gal, will wait 30 minutes after vaccine.  Reported no issues with previous vaccines  ?  ?Do you have a history of any of the following: ? ?Myocarditis or Pericarditis No ?Thrombosis with thrombocytopenia syndrome (TTS) No ?Multisystem Inflammatory Syndrome (MIS-C or MIS-A)? No ?Immune-mediate syndrome defined by thrombosis and thrombocytopenia, such as heparin--induced thrombocytopenia (HIT)  No ?Guillain-Barr? Syndrome (GBS) No ?COVID-19 disease within the  past 3 months? No ?Vaccinated with monkeypox vaccine in the last 4 weeks? No ?  ? ?EUA information sheet provided,  Tolerated well, After vaccine care reviewed   Copy of [facility] and COVID -19 Care provided.    ? ?Carolan Avedisian Sherrilyn Rist, RN  ?

## 2022-03-15 ENCOUNTER — Emergency Department
Admission: EM | Admit: 2022-03-15 | Discharge: 2022-03-15 | Disposition: A | Discharge status: Home / Self Care | Payer: BC Managed Care – PPO | Attending: Emergency Medicine | Admitting: Emergency Medicine

## 2022-03-15 ENCOUNTER — Other Ambulatory Visit: Payer: Self-pay

## 2022-03-15 ENCOUNTER — Encounter: Payer: Self-pay | Admitting: Emergency Medicine

## 2022-03-15 DIAGNOSIS — R112 Nausea with vomiting, unspecified: Secondary | ICD-10-CM | POA: Diagnosis not present

## 2022-03-15 DIAGNOSIS — R519 Headache, unspecified: Secondary | ICD-10-CM | POA: Diagnosis present

## 2022-03-15 LAB — CBC WITH DIFFERENTIAL/PLATELET
Abs Immature Granulocytes: 0.01 10*3/uL (ref 0.00–0.07)
Basophils Absolute: 0 10*3/uL (ref 0.0–0.1)
Basophils Relative: 0 %
Eosinophils Absolute: 0.1 10*3/uL (ref 0.0–0.5)
Eosinophils Relative: 2 %
HCT: 38.7 % — ABNORMAL LOW (ref 39.0–52.0)
Hemoglobin: 12.9 g/dL — ABNORMAL LOW (ref 13.0–17.0)
Immature Granulocytes: 0 %
Lymphocytes Relative: 17 %
Lymphs Abs: 1 10*3/uL (ref 0.7–4.0)
MCH: 28.5 pg (ref 26.0–34.0)
MCHC: 33.3 g/dL (ref 30.0–36.0)
MCV: 85.6 fL (ref 80.0–100.0)
Monocytes Absolute: 0.5 10*3/uL (ref 0.1–1.0)
Monocytes Relative: 8 %
Neutro Abs: 4.2 10*3/uL (ref 1.7–7.7)
Neutrophils Relative %: 73 %
Platelets: 114 10*3/uL — ABNORMAL LOW (ref 150–400)
RBC: 4.52 MIL/uL (ref 4.22–5.81)
RDW: 12.4 % (ref 11.5–15.5)
WBC: 5.7 10*3/uL (ref 4.0–10.5)
nRBC: 0 % (ref 0.0–0.2)

## 2022-03-15 LAB — BASIC METABOLIC PANEL
Anion gap: 8 (ref 5–15)
BUN: 12 mg/dL (ref 6–20)
CO2: 28 mmol/L (ref 22–32)
Calcium: 8.8 mg/dL — ABNORMAL LOW (ref 8.9–10.3)
Chloride: 102 mmol/L (ref 98–111)
Creatinine, Ser: 0.92 mg/dL (ref 0.61–1.24)
GFR, Estimated: >60 mL/min (ref >60)
Glucose, Bld: 109 mg/dL — ABNORMAL HIGH (ref 70–99)
Potassium: 3.9 mmol/L (ref 3.5–5.1)
Sodium: 138 mmol/L (ref 135–145)

## 2022-03-15 LAB — MAGNESIUM: Magnesium: 1.8 mg/dL (ref 1.7–2.4)

## 2022-03-15 MED ORDER — SODIUM CHLORIDE 0.9 % IV BOLUS
1000.0000 mL | Freq: Once | INTRAVENOUS | Status: AC
  Administered 2022-03-15: 1000 mL via INTRAVENOUS

## 2022-03-15 MED ORDER — DROPERIDOL 2.5 MG/ML IJ SOLN
2.5000 mg | Freq: Once | INTRAMUSCULAR | Status: AC
  Administered 2022-03-15: 2.5 mg via INTRAVENOUS
  Filled 2022-03-15: qty 2

## 2022-03-15 MED ORDER — MAGNESIUM SULFATE 2 GM/50ML IV SOLN
2.0000 g | Freq: Once | INTRAVENOUS | Status: AC
  Administered 2022-03-15: 2 g via INTRAVENOUS
  Filled 2022-03-15: qty 50

## 2022-03-15 MED ORDER — DEXAMETHASONE SODIUM PHOSPHATE 10 MG/ML IJ SOLN
10.0000 mg | Freq: Once | INTRAMUSCULAR | Status: AC
  Administered 2022-03-15: 10 mg via INTRAVENOUS
  Filled 2022-03-15: qty 1

## 2022-03-15 MED ORDER — DIPHENHYDRAMINE HCL 50 MG/ML IJ SOLN
12.5000 mg | INTRAMUSCULAR | Status: AC
  Administered 2022-03-15: 12.5 mg via INTRAVENOUS
  Filled 2022-03-15: qty 1

## 2022-03-15 MED ORDER — KETOROLAC TROMETHAMINE 30 MG/ML IJ SOLN
15.0000 mg | Freq: Once | INTRAMUSCULAR | Status: AC
  Administered 2022-03-15: 15 mg via INTRAVENOUS
  Filled 2022-03-15: qty 1

## 2022-03-15 NOTE — Discharge Instructions (Signed)

## 2022-03-15 NOTE — ED Triage Notes (Signed)
Patient ambulatory to triage with steady gait, without difficulty or distress noted; pt reports here for cluster HA since last night accomp by N/V; hx of same

## 2022-03-15 NOTE — ED Provider Notes (Signed)
[facility] Provider Note    Event Date/Time   First MD Initiated Contact with Patient 03/15/22 [phone]     (approximate)   History   Headache   HPI  Brett Stephenson is a 36 y.o. male who presents for evaluation of right-sided headache.  It has been going on more or less constantly for the last couple of days although it waxes and wanes.  He said that he has a history of cluster headaches for which she has seen a neurologist, and the neurologist told him that if it ever happened or got worse he should go to the emergency department and ask for high flow oxygen.  He said it feels similar to his prior headaches.  He has had no recent history of trauma.  No neck pain or stiffness.  The pain sometimes leads to nausea and he has had a couple of episodes of vomiting.  He does seem to be sensitive to light.  He denies recent illness including fever, chest pain, shortness of breath, and abdominal pain.  No recent medication changes or drug use.     Physical Exam   Triage Vital Signs: ED Triage Vitals  Enc Vitals Group     BP 03/15/22 0410 121/72     Pulse Rate 03/15/22 0410 86     Resp 03/15/22 0410 18     Temp 03/15/22 0410 98.1 F (36.7 C)     Temp Source 03/15/22 0410 Oral     SpO2 03/15/22 0410 99 %     Weight 03/15/22 0408 81.6 kg (180 lb)     Height 03/15/22 0408 1.778 m (5\' 10" )     Head Circumference --      Peak Flow --      Pain Score 03/15/22 0408 10     Pain Loc --      Pain Edu? --      Excl. in GC? --     Most recent vital signs: Vitals:   03/15/22 0410 03/15/22 0530  BP: 121/72   Pulse: 86 82  Resp: 18   Temp: 98.1 F (36.7 C)   SpO2: 99% 100%     General: Awake, no distress although he appears somewhat uncomfortable. Eyes:  Pupils are equal and reactive.  No nystagmus. CV:  Good peripheral perfusion.  Resp:  Normal effort.  Abd:  No distention.  Other:  No focal neurological deficits.   ED Results / Procedures / Treatments    Labs (all labs ordered are listed, but only abnormal results are displayed) Labs Reviewed  BASIC METABOLIC PANEL - Abnormal; Notable for the following components:      Result Value   Glucose, Bld 109 (*)    Calcium 8.8 (*)    All other components within normal limits  MAGNESIUM  CBC WITH DIFFERENTIAL/PLATELET     PROCEDURES:  Critical Care performed: No  Procedures   MEDICATIONS ORDERED IN ED: Medications  droperidol (INAPSINE) 2.5 MG/ML injection 2.5 mg (2.5 mg Intravenous Given 03/15/22 0545)  diphenhydrAMINE (BENADRYL) injection 12.5 mg (12.5 mg Intravenous Given 03/15/22 0545)  ketorolac (TORADOL) 30 MG/ML injection 15 mg (15 mg Intravenous Given 03/15/22 0544)  sodium chloride 0.9 % bolus 1,000 mL (0 mLs Intravenous Stopped 03/15/22 0642)  magnesium sulfate IVPB 2 g 50 mL (0 g Intravenous Stopped 03/15/22 0642)  dexamethasone (DECADRON) injection 10 mg (10 mg Intravenous Given 03/15/22 0544)     IMPRESSION / MDM / ASSESSMENT AND PLAN / ED COURSE  I reviewed the triage vital signs and the nursing notes.                              Differential diagnosis includes, but is not limited to, cluster headache, tension headache, migraine, less likely cavernous venous thrombosis or acute intracranial hemorrhage.  Patient's presentation is most consistent with severe exacerbation of chronic illness.  Patient's vital signs are stable and within normal limits and his physical exam is reassuring.  I agree that if the patient is having a cluster headache, high flow oxygen is the best treatment so I put the patient on a nonrebreather at 15 L/min and I will reassess after about 30 minutes.  Patient and his wife agree with the plan.  We will then determine if he needs additional medications or imaging.   Clinical Course as of 03/15/22 0655  Thu Mar 15, 2022  1601 I reassessed the patient after he has been on the nonrebreather for more than 30 minutes.  He said that the headache is come  down a little bit but he is actually feeling more nauseated.  He is still at about a 7 when he was a 10 previously.  While I was in the room talking with him about the next options, he began to vomit and vomited but was left in his stomach.  I recommended that we treat more aggressively for possible migraine.  He and his wife agreed.  I ordered droperidol 2.5 mg IV, diphenhydramine 12.5 mg IV, Toradol 15 mg IV, Decadron 10 mg IV, magnesium 2 g IV, and normal saline 1 L IV bolus.  I also ordered CBC with differential, basic metabolic panel, and magnesium levels. [CF]  [id number] The patient reports that his headache is almost completely resolved and he feels much better.  He is smiling and happy and is no longer nauseated.  He and his wife are both comfortable with the plan for discharge and outpatient follow-up.  I went over the medications that I gave him and he will follow-up with his outpatient provider including his neurologist, Dr. Manuella Ghazi.  I gave my usual and customary return precautions. [CF]    Clinical Course User Index [CF] Hinda Kehr, MD     FINAL CLINICAL IMPRESSION(S) / ED DIAGNOSES   Final diagnoses:  Headache disorder     Rx / DC Orders   ED Discharge Orders     None        Note:  This document was prepared using Dragon voice recognition software and may include unintentional dictation errors.   Hinda Kehr, MD 03/15/22 [phone]
